# Patient Record
Sex: Male | Born: 1990 | Race: White | Hispanic: Yes | Marital: Married | Smoking: Never smoker
Health system: Southern US, Community
[De-identification: ages and names within clinical notes are randomized; demographics above are authoritative.]

## PROBLEM LIST (undated history)

## (undated) DIAGNOSIS — S069X9A Unspecified intracranial injury with loss of consciousness of unspecified duration, initial encounter: Secondary | ICD-10-CM

## (undated) DIAGNOSIS — G8194 Hemiplegia, unspecified affecting left nondominant side: Secondary | ICD-10-CM

## (undated) DIAGNOSIS — J189 Pneumonia, unspecified organism: Secondary | ICD-10-CM

## (undated) DIAGNOSIS — S52611G Displaced fracture of right ulna styloid process, subsequent encounter for closed fracture with delayed healing: Secondary | ICD-10-CM

## (undated) DIAGNOSIS — N179 Acute kidney failure, unspecified: Secondary | ICD-10-CM

## (undated) DIAGNOSIS — R6510 Systemic inflammatory response syndrome (SIRS) of non-infectious origin without acute organ dysfunction: Secondary | ICD-10-CM

## (undated) DIAGNOSIS — R7881 Bacteremia: Secondary | ICD-10-CM

## (undated) DIAGNOSIS — S069XAA Unspecified intracranial injury with loss of consciousness status unknown, initial encounter: Secondary | ICD-10-CM

## (undated) HISTORY — PX: TRACHEOSTOMY: SUR1362

## (undated) HISTORY — PX: PEG TUBE PLACEMENT: SUR1034

---

## 2016-05-15 ENCOUNTER — Inpatient Hospital Stay (HOSPITAL_COMMUNITY)
Admission: EM | Admit: 2016-05-15 | Discharge: 2016-05-25 | DRG: 871 | Disposition: A | Discharge status: Rehabilitation Facility | Payer: Medicaid Other | Source: Other Acute Inpatient Hospital | Attending: Internal Medicine | Admitting: Internal Medicine

## 2016-05-15 DIAGNOSIS — Z1612 Extended spectrum beta lactamase (ESBL) resistance: Secondary | ICD-10-CM | POA: Diagnosis not present

## 2016-05-15 DIAGNOSIS — H4902 Third [oculomotor] nerve palsy, left eye: Secondary | ICD-10-CM | POA: Diagnosis present

## 2016-05-15 DIAGNOSIS — M10062 Idiopathic gout, left knee: Secondary | ICD-10-CM | POA: Diagnosis present

## 2016-05-15 DIAGNOSIS — G8114 Spastic hemiplegia affecting left nondominant side: Secondary | ICD-10-CM | POA: Diagnosis not present

## 2016-05-15 DIAGNOSIS — R1313 Dysphagia, pharyngeal phase: Secondary | ICD-10-CM | POA: Diagnosis present

## 2016-05-15 DIAGNOSIS — Y95 Nosocomial condition: Secondary | ICD-10-CM | POA: Diagnosis present

## 2016-05-15 DIAGNOSIS — R7881 Bacteremia: Secondary | ICD-10-CM | POA: Diagnosis not present

## 2016-05-15 DIAGNOSIS — R000 Tachycardia, unspecified: Secondary | ICD-10-CM

## 2016-05-15 DIAGNOSIS — S069X9D Unspecified intracranial injury with loss of consciousness of unspecified duration, subsequent encounter: Secondary | ICD-10-CM | POA: Diagnosis not present

## 2016-05-15 DIAGNOSIS — E875 Hyperkalemia: Secondary | ICD-10-CM | POA: Diagnosis present

## 2016-05-15 DIAGNOSIS — L299 Pruritus, unspecified: Secondary | ICD-10-CM | POA: Diagnosis present

## 2016-05-15 DIAGNOSIS — D72819 Decreased white blood cell count, unspecified: Secondary | ICD-10-CM | POA: Diagnosis present

## 2016-05-15 DIAGNOSIS — Z930 Tracheostomy status: Secondary | ICD-10-CM | POA: Diagnosis not present

## 2016-05-15 DIAGNOSIS — E878 Other disorders of electrolyte and fluid balance, not elsewhere classified: Secondary | ICD-10-CM | POA: Diagnosis present

## 2016-05-15 DIAGNOSIS — Z87820 Personal history of traumatic brain injury: Secondary | ICD-10-CM

## 2016-05-15 DIAGNOSIS — N179 Acute kidney failure, unspecified: Secondary | ICD-10-CM | POA: Diagnosis present

## 2016-05-15 DIAGNOSIS — A419 Sepsis, unspecified organism: Principal | ICD-10-CM | POA: Diagnosis present

## 2016-05-15 DIAGNOSIS — S069X9A Unspecified intracranial injury with loss of consciousness of unspecified duration, initial encounter: Secondary | ICD-10-CM | POA: Diagnosis present

## 2016-05-15 DIAGNOSIS — Z79899 Other long term (current) drug therapy: Secondary | ICD-10-CM

## 2016-05-15 DIAGNOSIS — M25462 Effusion, left knee: Secondary | ICD-10-CM | POA: Diagnosis not present

## 2016-05-15 DIAGNOSIS — A021 Salmonella sepsis: Secondary | ICD-10-CM

## 2016-05-15 DIAGNOSIS — G8194 Hemiplegia, unspecified affecting left nondominant side: Secondary | ICD-10-CM | POA: Diagnosis present

## 2016-05-15 DIAGNOSIS — Z1624 Resistance to multiple antibiotics: Secondary | ICD-10-CM | POA: Diagnosis present

## 2016-05-15 DIAGNOSIS — E871 Hypo-osmolality and hyponatremia: Secondary | ICD-10-CM | POA: Diagnosis present

## 2016-05-15 DIAGNOSIS — M25562 Pain in left knee: Secondary | ICD-10-CM

## 2016-05-15 DIAGNOSIS — R339 Retention of urine, unspecified: Secondary | ICD-10-CM | POA: Diagnosis present

## 2016-05-15 DIAGNOSIS — Z931 Gastrostomy status: Secondary | ICD-10-CM

## 2016-05-15 DIAGNOSIS — N319 Neuromuscular dysfunction of bladder, unspecified: Secondary | ICD-10-CM | POA: Diagnosis not present

## 2016-05-15 DIAGNOSIS — S069XAA Unspecified intracranial injury with loss of consciousness status unknown, initial encounter: Secondary | ICD-10-CM | POA: Diagnosis present

## 2016-05-15 DIAGNOSIS — A499 Bacterial infection, unspecified: Secondary | ICD-10-CM | POA: Diagnosis not present

## 2016-05-15 DIAGNOSIS — D638 Anemia in other chronic diseases classified elsewhere: Secondary | ICD-10-CM | POA: Diagnosis present

## 2016-05-15 DIAGNOSIS — S069X4S Unspecified intracranial injury with loss of consciousness of 6 hours to 24 hours, sequela: Secondary | ICD-10-CM | POA: Diagnosis not present

## 2016-05-15 DIAGNOSIS — J150 Pneumonia due to Klebsiella pneumoniae: Secondary | ICD-10-CM | POA: Diagnosis present

## 2016-05-15 DIAGNOSIS — M792 Neuralgia and neuritis, unspecified: Secondary | ICD-10-CM | POA: Diagnosis not present

## 2016-05-15 DIAGNOSIS — J9621 Acute and chronic respiratory failure with hypoxia: Secondary | ICD-10-CM | POA: Diagnosis present

## 2016-05-15 DIAGNOSIS — R509 Fever, unspecified: Secondary | ICD-10-CM | POA: Diagnosis present

## 2016-05-15 DIAGNOSIS — M7989 Other specified soft tissue disorders: Secondary | ICD-10-CM | POA: Diagnosis not present

## 2016-05-15 DIAGNOSIS — K592 Neurogenic bowel, not elsewhere classified: Secondary | ICD-10-CM | POA: Diagnosis not present

## 2016-05-15 DIAGNOSIS — G8190 Hemiplegia, unspecified affecting unspecified side: Secondary | ICD-10-CM | POA: Diagnosis not present

## 2016-05-15 DIAGNOSIS — R5383 Other fatigue: Secondary | ICD-10-CM | POA: Diagnosis not present

## 2016-05-15 DIAGNOSIS — E162 Hypoglycemia, unspecified: Secondary | ICD-10-CM | POA: Diagnosis present

## 2016-05-15 DIAGNOSIS — J189 Pneumonia, unspecified organism: Secondary | ICD-10-CM | POA: Diagnosis not present

## 2016-05-15 DIAGNOSIS — R1312 Dysphagia, oropharyngeal phase: Secondary | ICD-10-CM | POA: Diagnosis not present

## 2016-05-15 DIAGNOSIS — G8100 Flaccid hemiplegia affecting unspecified side: Secondary | ICD-10-CM | POA: Diagnosis not present

## 2016-05-15 HISTORY — DX: Systemic inflammatory response syndrome (sirs) of non-infectious origin without acute organ dysfunction: R65.10

## 2016-05-15 HISTORY — DX: Bacteremia: R78.81

## 2016-05-15 HISTORY — DX: Pneumonia, unspecified organism: J18.9

## 2016-05-15 HISTORY — DX: Hemiplegia, unspecified affecting left nondominant side: G81.94

## 2016-05-15 HISTORY — DX: Unspecified intracranial injury with loss of consciousness status unknown, initial encounter: S06.9XAA

## 2016-05-15 HISTORY — DX: Acute kidney failure, unspecified: N17.9

## 2016-05-15 HISTORY — DX: Unspecified intracranial injury with loss of consciousness of unspecified duration, initial encounter: S06.9X9A

## 2016-05-15 HISTORY — DX: Displaced fracture of right ulna styloid process, subsequent encounter for closed fracture with delayed healing: S52.611G

## 2016-05-15 NOTE — Progress Notes (Addendum)
Family wants patient to go to [facility] at discharge. Contact information for the patient's sister: Alicia AmelHerminia [name] [phone]; Patient's niece speaks more english according to family: [name] ShihJenny [name] [phone]

## 2016-05-15 NOTE — Progress Notes (Signed)
Patient arrived to 2C12 from [facility]. Patient is spanish speaking. Oriented to room, in no apparent distress.

## 2016-05-16 ENCOUNTER — Encounter (HOSPITAL_COMMUNITY): Payer: Self-pay | Admitting: *Deleted

## 2016-05-16 ENCOUNTER — Inpatient Hospital Stay (HOSPITAL_COMMUNITY): Payer: Medicaid Other

## 2016-05-16 DIAGNOSIS — G8190 Hemiplegia, unspecified affecting unspecified side: Secondary | ICD-10-CM

## 2016-05-16 DIAGNOSIS — Z930 Tracheostomy status: Secondary | ICD-10-CM

## 2016-05-16 DIAGNOSIS — S069XAA Unspecified intracranial injury with loss of consciousness status unknown, initial encounter: Secondary | ICD-10-CM | POA: Diagnosis present

## 2016-05-16 DIAGNOSIS — S069X9A Unspecified intracranial injury with loss of consciousness of unspecified duration, initial encounter: Secondary | ICD-10-CM | POA: Diagnosis present

## 2016-05-16 DIAGNOSIS — Z931 Gastrostomy status: Secondary | ICD-10-CM

## 2016-05-16 DIAGNOSIS — A419 Sepsis, unspecified organism: Secondary | ICD-10-CM | POA: Diagnosis present

## 2016-05-16 DIAGNOSIS — R000 Tachycardia, unspecified: Secondary | ICD-10-CM

## 2016-05-16 LAB — URINE MICROSCOPIC-ADD ON

## 2016-05-16 LAB — BASIC METABOLIC PANEL
ANION GAP: 10 (ref 5–15)
BUN: 23 mg/dL — ABNORMAL HIGH (ref 6–20)
CO2: 22 mmol/L (ref 22–32)
Calcium: 8.9 mg/dL (ref 8.9–10.3)
Chloride: 100 mmol/L — ABNORMAL LOW (ref 101–111)
Creatinine, Ser: 1.44 mg/dL — ABNORMAL HIGH (ref 0.61–1.24)
GFR calc Af Amer: >60 mL/min (ref >60)
GLUCOSE: 96 mg/dL (ref 65–99)
POTASSIUM: 5.2 mmol/L — AB (ref 3.5–5.1)
Sodium: 132 mmol/L — ABNORMAL LOW (ref 135–145)

## 2016-05-16 LAB — URINALYSIS, ROUTINE W REFLEX MICROSCOPIC
Bilirubin Urine: NEGATIVE
GLUCOSE, UA: NEGATIVE mg/dL
Ketones, ur: NEGATIVE mg/dL
Nitrite: NEGATIVE
PH: 6 (ref 5.0–8.0)
PROTEIN: 100 mg/dL — AB
Specific Gravity, Urine: 1.014 (ref 1.005–1.030)

## 2016-05-16 LAB — CBC WITH DIFFERENTIAL/PLATELET
BASOS PCT: 1 %
Basophils Absolute: 0 10*3/uL (ref 0.0–0.1)
EOS ABS: 0.2 10*3/uL (ref 0.0–0.7)
EOS PCT: 8 %
HCT: 27.3 % — ABNORMAL LOW (ref 39.0–52.0)
Hemoglobin: 8.8 g/dL — ABNORMAL LOW (ref 13.0–17.0)
LYMPHS ABS: 0.8 10*3/uL (ref 0.7–4.0)
Lymphocytes Relative: 37 %
MCH: 30.1 pg (ref 26.0–34.0)
MCHC: 32.2 g/dL (ref 30.0–36.0)
MCV: 93.5 fL (ref 78.0–100.0)
MONO ABS: 0.5 10*3/uL (ref 0.1–1.0)
Monocytes Relative: 20 %
NEUTROS PCT: 34 %
Neutro Abs: 0.8 10*3/uL — ABNORMAL LOW (ref 1.7–7.7)
PLATELETS: 236 10*3/uL (ref 150–400)
RBC: 2.92 MIL/uL — AB (ref 4.22–5.81)
RDW: 14.7 % (ref 11.5–15.5)
WBC: 2.3 10*3/uL — AB (ref 4.0–10.5)

## 2016-05-16 LAB — COMPREHENSIVE METABOLIC PANEL
ALT: 15 U/L — AB (ref 17–63)
AST: 17 U/L (ref 15–41)
Albumin: 3.4 g/dL — ABNORMAL LOW (ref 3.5–5.0)
Alkaline Phosphatase: 73 U/L (ref 38–126)
Anion gap: 8 (ref 5–15)
BUN: 23 mg/dL — ABNORMAL HIGH (ref 6–20)
CHLORIDE: 105 mmol/L (ref 101–111)
CO2: 28 mmol/L (ref 22–32)
Calcium: 9.7 mg/dL (ref 8.9–10.3)
Creatinine, Ser: 1.31 mg/dL — ABNORMAL HIGH (ref 0.61–1.24)
Glucose, Bld: 90 mg/dL (ref 65–99)
POTASSIUM: 4.9 mmol/L (ref 3.5–5.1)
SODIUM: 141 mmol/L (ref 135–145)
Total Bilirubin: 0.8 mg/dL (ref 0.3–1.2)
Total Protein: 6.5 g/dL (ref 6.5–8.1)

## 2016-05-16 LAB — GLUCOSE, CAPILLARY
GLUCOSE-CAPILLARY: 109 mg/dL — AB (ref 65–99)
GLUCOSE-CAPILLARY: 89 mg/dL (ref 65–99)
Glucose-Capillary: 63 mg/dL — ABNORMAL LOW (ref 65–99)
Glucose-Capillary: 113 mg/dL — ABNORMAL HIGH (ref 65–99)
Glucose-Capillary: 87 mg/dL (ref 65–99)
Glucose-Capillary: 80 mg/dL (ref 65–99)

## 2016-05-16 LAB — APTT: APTT: 35 s (ref 24–36)

## 2016-05-16 LAB — PROTIME-INR
INR: 1.15
PROTHROMBIN TIME: 14.8 s (ref 11.4–15.2)

## 2016-05-16 LAB — MAGNESIUM: Magnesium: 2 mg/dL (ref 1.7–2.4)

## 2016-05-16 LAB — PROCALCITONIN: PROCALCITONIN: 0.11 ng/mL

## 2016-05-16 LAB — LACTIC ACID, PLASMA
LACTIC ACID, VENOUS: 0.8 mmol/L (ref 0.5–1.9)
LACTIC ACID, VENOUS: 1.3 mmol/L (ref 0.5–1.9)

## 2016-05-16 LAB — MRSA PCR SCREENING: MRSA by PCR: NEGATIVE

## 2016-05-16 MED ORDER — DEXTROSE 50 % IV SOLN
INTRAVENOUS | Status: AC
  Administered 2016-05-16: 25 mL via INTRAVENOUS
  Filled 2016-05-16: qty 50

## 2016-05-16 MED ORDER — ACETAMINOPHEN 325 MG PO TABS
650.0000 mg | ORAL_TABLET | Freq: Four times a day (QID) | ORAL | Status: DC | PRN
  Administered 2016-05-16 – 2016-05-22 (×5): 650 mg via ORAL
  Filled 2016-05-16 (×5): qty 2

## 2016-05-16 MED ORDER — DEXTROSE 50 % IV SOLN
25.0000 mL | Freq: Once | INTRAVENOUS | Status: AC
  Administered 2016-05-16: 25 mL via INTRAVENOUS

## 2016-05-16 MED ORDER — DEXTROSE-NACL 5-0.45 % IV SOLN
INTRAVENOUS | Status: DC
  Administered 2016-05-16: 12:00:00 via INTRAVENOUS

## 2016-05-16 MED ORDER — MORPHINE SULFATE (PF) 2 MG/ML IV SOLN
2.0000 mg | INTRAVENOUS | Status: DC | PRN
  Administered 2016-05-16 – 2016-05-24 (×14): 2 mg via INTRAVENOUS
  Filled 2016-05-16 (×14): qty 1

## 2016-05-16 MED ORDER — PANTOPRAZOLE SODIUM 40 MG PO TBEC
40.0000 mg | DELAYED_RELEASE_TABLET | Freq: Every day | ORAL | Status: DC
  Administered 2016-05-16 – 2016-05-25 (×10): 40 mg via ORAL
  Filled 2016-05-16 (×9): qty 1

## 2016-05-16 MED ORDER — VITAL AF 1.2 CAL PO LIQD
1000.0000 mL | ORAL | Status: DC
  Administered 2016-05-16 – 2016-05-24 (×14): 1000 mL
  Filled 2016-05-16 (×23): qty 1000

## 2016-05-16 MED ORDER — ACETAMINOPHEN 650 MG RE SUPP: 650.0000 mg | Freq: Four times a day (QID) | RECTAL | Status: DC | PRN

## 2016-05-16 MED ORDER — PHENOL 1.4 % MT LIQD
1.0000 | OROMUCOSAL | Status: DC | PRN
  Filled 2016-05-16: qty 177

## 2016-05-16 MED ORDER — DEXTROSE-NACL 5-0.9 % IV SOLN
INTRAVENOUS | Status: DC
  Administered 2016-05-16 – 2016-05-19 (×4): via INTRAVENOUS

## 2016-05-16 MED ORDER — PIPERACILLIN-TAZOBACTAM 3.375 G IVPB
3.3750 g | Freq: Three times a day (TID) | INTRAVENOUS | Status: DC
  Administered 2016-05-16 – 2016-05-18 (×7): 3.375 g via INTRAVENOUS
  Filled 2016-05-16 (×9): qty 50

## 2016-05-16 MED ORDER — ORAL CARE MOUTH RINSE
15.0000 mL | Freq: Two times a day (BID) | OROMUCOSAL | Status: DC
  Administered 2016-05-16 – 2016-05-25 (×18): 15 mL via OROMUCOSAL

## 2016-05-16 MED ORDER — ONDANSETRON HCL 4 MG PO TABS: 4.0000 mg | ORAL_TABLET | Freq: Four times a day (QID) | ORAL | Status: DC | PRN

## 2016-05-16 MED ORDER — SODIUM CHLORIDE 0.9 % IV SOLN
INTRAVENOUS | Status: DC
  Administered 2016-05-16: 01:00:00 via INTRAVENOUS

## 2016-05-16 MED ORDER — LORAZEPAM 2 MG/ML IJ SOLN
1.0000 mg | Freq: Once | INTRAMUSCULAR | Status: AC | PRN
  Administered 2016-05-16: 1 mg via INTRAVENOUS
  Filled 2016-05-16: qty 1

## 2016-05-16 MED ORDER — IPRATROPIUM-ALBUTEROL 0.5-2.5 (3) MG/3ML IN SOLN: 3.0000 mL | RESPIRATORY_TRACT | Status: DC | PRN

## 2016-05-16 MED ORDER — ONDANSETRON HCL 4 MG/2ML IJ SOLN: 4.0000 mg | Freq: Four times a day (QID) | INTRAMUSCULAR | Status: DC | PRN

## 2016-05-16 MED ORDER — INFLUENZA VAC SPLIT QUAD 0.5 ML IM SUSY
0.5000 mL | PREFILLED_SYRINGE | INTRAMUSCULAR | Status: AC
  Administered 2016-05-25: 0.5 mL via INTRAMUSCULAR
  Filled 2016-05-16 (×2): qty 0.5

## 2016-05-16 MED ORDER — METOPROLOL TARTRATE 25 MG PO TABS
25.0000 mg | ORAL_TABLET | Freq: Two times a day (BID) | ORAL | Status: DC
  Administered 2016-05-16 – 2016-05-20 (×10): 25 mg via ORAL
  Filled 2016-05-16 (×10): qty 1

## 2016-05-16 MED ORDER — CHLORHEXIDINE GLUCONATE 0.12 % MT SOLN
15.0000 mL | Freq: Two times a day (BID) | OROMUCOSAL | Status: DC
  Administered 2016-05-16 – 2016-05-25 (×19): 15 mL via OROMUCOSAL
  Filled 2016-05-16 (×7): qty 15

## 2016-05-16 MED ORDER — JEVITY 1.2 CAL PO LIQD
1000.0000 mL | ORAL | Status: DC
Start: 2016-05-16 — End: 2016-05-16

## 2016-05-16 MED ORDER — DIPHENHYDRAMINE HCL 25 MG PO CAPS
25.0000 mg | ORAL_CAPSULE | Freq: Four times a day (QID) | ORAL | Status: DC | PRN
Start: 2016-05-16 — End: 2016-05-25
  Administered 2016-05-16 – 2016-05-17 (×2): 25 mg via ORAL
  Filled 2016-05-16 (×2): qty 1

## 2016-05-16 MED ORDER — SODIUM CHLORIDE 0.9% FLUSH
3.0000 mL | Freq: Two times a day (BID) | INTRAVENOUS | Status: DC
  Administered 2016-05-16 – 2016-05-25 (×18): 3 mL via INTRAVENOUS

## 2016-05-16 MED ORDER — SODIUM CHLORIDE 0.9 % IV BOLUS (SEPSIS)
500.0000 mL | Freq: Once | INTRAVENOUS | Status: AC
  Administered 2016-05-16: 500 mL via INTRAVENOUS

## 2016-05-16 MED ORDER — VANCOMYCIN HCL IN DEXTROSE 750-5 MG/150ML-% IV SOLN
750.0000 mg | Freq: Three times a day (TID) | INTRAVENOUS | Status: DC
Start: 2016-05-16 — End: 2016-05-17
  Administered 2016-05-16 – 2016-05-17 (×5): 750 mg via INTRAVENOUS
  Filled 2016-05-16 (×6): qty 150
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
## 2016-05-16 NOTE — Progress Notes (Signed)
Hypoglycemic Event  CBG: 63  Treatment: D50 IV 25 mL  Symptoms: None  Follow-up CBG: Time:1009 CBG Result:113  Possible Reasons for Event: Inadequate meal intake  Comments/MD notified: MD Arriem notified via text page. Dietician paged for nutrition orders    Karn PicklerWalker, Heliodoro Domagalski L

## 2016-05-16 NOTE — Progress Notes (Signed)
PT Cancellation Note  Patient Details Name: [name] [name] MRN: [id number] DOB: 04/13/1991   Cancelled Treatment:    Reason Eval/Treat Not Completed: Patient not medically ready.  Patient remains on strict bedrest per orders.  *MD:  Please write activity orders when appropriate for patient.  PT will initiate evaluation at that time.  Thank you.   Vena AustriaDavis, Shakara Tweedy H 05/16/2016, 12:57 PM Durenda HurtSusan H. Renaldo Fiddleravis, PT, [facility] Acute Rehab Services Pager [phone]
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
## 2016-05-16 NOTE — Progress Notes (Signed)
Pt complaining of itching after Ativan given. Ativan added to allergy list and Elray McgregorMary Lynch, NP notified, benadryl given per NP order.

## 2016-05-16 NOTE — Progress Notes (Signed)
RT Note: Sputum sample collected and sent to lab. Pt had minimal amounts of thick tan/white secretions. Pt tolerated well. No complications noted
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
## 2016-05-16 NOTE — Progress Notes (Signed)
MD called earlier in the evening for soft BP, verbal order received for 500CC bolus of NS. BP after bolus completed is 96/63 with MAP of 75. MD made aware via text.
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
## 2016-05-17 ENCOUNTER — Inpatient Hospital Stay (HOSPITAL_COMMUNITY): Payer: Medicaid Other

## 2016-05-17 DIAGNOSIS — J189 Pneumonia, unspecified organism: Secondary | ICD-10-CM

## 2016-05-17 LAB — BASIC METABOLIC PANEL
ANION GAP: 9 (ref 5–15)
BUN: 24 mg/dL — ABNORMAL HIGH (ref 6–20)
CALCIUM: 8.5 mg/dL — AB (ref 8.9–10.3)
CHLORIDE: 106 mmol/L (ref 101–111)
CO2: 23 mmol/L (ref 22–32)
CREATININE: 1.81 mg/dL — AB (ref 0.61–1.24)
GFR calc Af Amer: 58 mL/min — ABNORMAL LOW (ref >60)
GFR calc non Af Amer: 50 mL/min — ABNORMAL LOW (ref >60)
Glucose, Bld: 106 mg/dL — ABNORMAL HIGH (ref 65–99)
POTASSIUM: 4.1 mmol/L (ref 3.5–5.1)
SODIUM: 138 mmol/L (ref 135–145)

## 2016-05-17 LAB — PREPARE RBC (CROSSMATCH)

## 2016-05-17 LAB — CBC WITH DIFFERENTIAL/PLATELET
BASOS PCT: 1 %
Basophils Absolute: 0 10*3/uL (ref 0.0–0.1)
EOS ABS: 0.5 10*3/uL (ref 0.0–0.7)
Eosinophils Relative: 23 %
HCT: 24.5 % — ABNORMAL LOW (ref 39.0–52.0)
HEMOGLOBIN: 7.8 g/dL — AB (ref 13.0–17.0)
LYMPHS PCT: 22 %
Lymphs Abs: 0.4 10*3/uL — ABNORMAL LOW (ref 0.7–4.0)
MCH: 30.5 pg (ref 26.0–34.0)
MCHC: 31.8 g/dL (ref 30.0–36.0)
MCV: 95.7 fL (ref 78.0–100.0)
MONOS PCT: 9 %
Monocytes Absolute: 0.2 10*3/uL (ref 0.1–1.0)
NEUTROS ABS: 0.9 10*3/uL — AB (ref 1.7–7.7)
NEUTROS PCT: 45 %
PLATELETS: 184 10*3/uL (ref 150–400)
RBC: 2.56 MIL/uL — ABNORMAL LOW (ref 4.22–5.81)
RDW: 15.3 % (ref 11.5–15.5)
WBC MORPHOLOGY: INCREASED
WBC: 2 10*3/uL — ABNORMAL LOW (ref 4.0–10.5)

## 2016-05-17 LAB — HEMOGLOBIN AND HEMATOCRIT, BLOOD
HCT: 22 % — ABNORMAL LOW (ref 39.0–52.0)
Hemoglobin: 7 g/dL — ABNORMAL LOW (ref 13.0–17.0)

## 2016-05-17 LAB — GLUCOSE, CAPILLARY
GLUCOSE-CAPILLARY: 137 mg/dL — AB (ref 65–99)
GLUCOSE-CAPILLARY: 117 mg/dL — AB (ref 65–99)
GLUCOSE-CAPILLARY: 113 mg/dL — AB (ref 65–99)
Glucose-Capillary: 94 mg/dL (ref 65–99)
Glucose-Capillary: 122 mg/dL — ABNORMAL HIGH (ref 65–99)
Glucose-Capillary: 133 mg/dL — ABNORMAL HIGH (ref 65–99)

## 2016-05-17 LAB — ABO/RH: ABO/RH(D): O NEG

## 2016-05-17 LAB — IRON AND TIBC
IRON: 43 ug/dL — AB (ref 45–182)
SATURATION RATIOS: 25 % (ref 17.9–39.5)
TIBC: 174 ug/dL — AB (ref 250–450)
UIBC: 131 ug/dL

## 2016-05-17 LAB — FERRITIN: FERRITIN: 782 ng/mL — AB (ref 24–336)

## 2016-05-17 MED ORDER — VANCOMYCIN HCL IN DEXTROSE 750-5 MG/150ML-% IV SOLN
750.0000 mg | Freq: Two times a day (BID) | INTRAVENOUS | Status: DC
  Administered 2016-05-17 – 2016-05-18 (×2): 750 mg via INTRAVENOUS
  Filled 2016-05-17 (×3): qty 150

## 2016-05-17 MED ORDER — SODIUM CHLORIDE 0.9 % IV BOLUS (SEPSIS)
500.0000 mL | Freq: Once | INTRAVENOUS | Status: AC
  Administered 2016-05-17: 500 mL via INTRAVENOUS

## 2016-05-17 MED ORDER — SODIUM CHLORIDE 0.9 % IV SOLN
Freq: Once | INTRAVENOUS | Status: AC
  Administered 2016-05-17: 17:00:00 via INTRAVENOUS

## 2016-05-17 MED ORDER — LEVOFLOXACIN IN D5W 750 MG/150ML IV SOLN
750.0000 mg | INTRAVENOUS | Status: DC
  Administered 2016-05-17 – 2016-05-18 (×2): 750 mg via INTRAVENOUS
  Filled 2016-05-17 (×2): qty 150
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
## 2016-05-17 NOTE — Progress Notes (Signed)
PT Cancellation Note  Patient Details Name: [name] [name] MRN: [id number] DOB: 04/22/1991   Cancelled Treatment:      Patient remains on strict bedrest per orders.  *MD:  Please write activity orders when appropriate for patient.  PT will initiate evaluation at that time   [name] [name], [name] [name] 05/17/2016, 7:23 AM
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
## 2016-05-18 ENCOUNTER — Inpatient Hospital Stay (HOSPITAL_COMMUNITY): Payer: Medicaid Other

## 2016-05-18 DIAGNOSIS — R7881 Bacteremia: Secondary | ICD-10-CM

## 2016-05-18 LAB — TYPE AND SCREEN
ABO/RH(D): O NEG
Antibody Screen: NEGATIVE
UNIT DIVISION: 0

## 2016-05-18 LAB — CULTURE, RESPIRATORY

## 2016-05-18 LAB — ECHOCARDIOGRAM COMPLETE
CHL CUP MV DEC (S): 127
CHL CUP RV SYS PRESS: 29 mmHg
E decel time: 127 msec
EERAT: 9.04
FS: 23 % — AB (ref 28–44)
Height: 67 in
IV/PV OW: 0.84
LA diam end sys: 33 mm
LADIAMINDEX: 1.74 cm/m2
LASIZE: 33 mm
LAVOL: 47.5 mL
LAVOLA4C: 37.6 mL
LAVOLIN: 25 mL/m2
LDCA: 5.31 cm2
LV E/e' medial: 9.04
LV TDI E'MEDIAL: 8.59
LV e' LATERAL: 10.3 cm/s
LVEEAVG: 9.04
LVOTD: 26 mm
Lateral S' vel: 12.9 cm/s
MV Peak grad: 3 mmHg
MV pk E vel: 93.1 m/s
MVPKAVEL: 59.7 m/s
PW: 10 mm — AB (ref 0.6–1.1)
RV TAPSE: 22.4 mm
Reg peak vel: 254 cm/s
TDI e' lateral: 10.3
TR max vel: 254 cm/s
Weight: 2751.34 oz

## 2016-05-18 LAB — CBC WITH DIFFERENTIAL/PLATELET
BASOS PCT: 1 %
Basophils Absolute: 0 10*3/uL (ref 0.0–0.1)
EOS ABS: 0.5 10*3/uL (ref 0.0–0.7)
EOS PCT: 30 %
HEMATOCRIT: 24.8 % — AB (ref 39.0–52.0)
Hemoglobin: 7.9 g/dL — ABNORMAL LOW (ref 13.0–17.0)
LYMPHS ABS: 0.5 10*3/uL — AB (ref 0.7–4.0)
Lymphocytes Relative: 32 %
MCH: 29.3 pg (ref 26.0–34.0)
MCHC: 31.9 g/dL (ref 30.0–36.0)
MCV: 91.9 fL (ref 78.0–100.0)
MONO ABS: 0.3 10*3/uL (ref 0.1–1.0)
Monocytes Relative: 16 %
Neutro Abs: 0.4 10*3/uL — ABNORMAL LOW (ref 1.7–7.7)
Neutrophils Relative %: 21 %
PLATELETS: 153 10*3/uL (ref 150–400)
RBC: 2.7 MIL/uL — ABNORMAL LOW (ref 4.22–5.81)
RDW: 16 % — AB (ref 11.5–15.5)
WBC Morphology: INCREASED
WBC: 1.7 10*3/uL — AB (ref 4.0–10.5)

## 2016-05-18 LAB — BASIC METABOLIC PANEL
ANION GAP: 9 (ref 5–15)
BUN: 19 mg/dL (ref 6–20)
CALCIUM: 8.4 mg/dL — AB (ref 8.9–10.3)
CO2: 21 mmol/L — ABNORMAL LOW (ref 22–32)
Chloride: 108 mmol/L (ref 101–111)
Creatinine, Ser: 1.53 mg/dL — ABNORMAL HIGH (ref 0.61–1.24)
GLUCOSE: 142 mg/dL — AB (ref 65–99)
Potassium: 4.7 mmol/L (ref 3.5–5.1)
Sodium: 138 mmol/L (ref 135–145)

## 2016-05-18 LAB — GLUCOSE, CAPILLARY
GLUCOSE-CAPILLARY: 122 mg/dL — AB (ref 65–99)
GLUCOSE-CAPILLARY: 106 mg/dL — AB (ref 65–99)
GLUCOSE-CAPILLARY: 105 mg/dL — AB (ref 65–99)
Glucose-Capillary: 100 mg/dL — ABNORMAL HIGH (ref 65–99)
Glucose-Capillary: 109 mg/dL — ABNORMAL HIGH (ref 65–99)
Glucose-Capillary: 126 mg/dL — ABNORMAL HIGH (ref 65–99)

## 2016-05-18 LAB — CULTURE, RESPIRATORY W GRAM STAIN

## 2016-05-18 LAB — PATHOLOGIST SMEAR REVIEW

## 2016-05-18 MED ORDER — SODIUM CHLORIDE 0.9 % IV SOLN
500.0000 mg | Freq: Four times a day (QID) | INTRAVENOUS | Status: DC
  Administered 2016-05-18 – 2016-05-25 (×26): 500 mg via INTRAVENOUS
  Filled 2016-05-18 (×30): qty 500

## 2016-05-18 NOTE — Progress Notes (Signed)
  Echocardiogram 2D Echocardiogram has been performed.  [name] [name], [name] [name] 05/18/2016, 5:39 PM
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
## 2016-05-18 NOTE — Progress Notes (Signed)
This RN spoke with MD Cena BentonVega. [name] [name] [facility] for wife to be able to come to [place] from [place] to visit husband ([name] [name]). MD Cena BentonVega request for CSW to find out how this process should be handled and he will write note if necessary at that time. Will speak with CSW.
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
## 2016-05-18 NOTE — Care Management Note (Signed)
Case Management Note  Patient Details  Name: Thadius Smisek MRN: [id number] Date of Birth: 02/01/1991  Subjective/Objective:   CM received referral for pt placement at [facility].  Per [facility] liaison, pt does not meet criteria for admission, plus niece states pt has medicaid which has no LTAC benefit.  CM met with mother and niece @ bedside and the mother stated she just wanted pt to get better so she could take him home and care for him and niece agreed. CM escorted niece to admissions office so she could provide ptt's social security number and medicaid number.                           Expected Discharge Plan:  [place]  Discharge planning Services  CM Consult  Status of Service:  In process, will continue to follow  Girard Cooter, RN 05/18/2016, 12:13 PM
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
## 2016-05-19 LAB — GLUCOSE, CAPILLARY
GLUCOSE-CAPILLARY: 113 mg/dL — AB (ref 65–99)
GLUCOSE-CAPILLARY: 141 mg/dL — AB (ref 65–99)
GLUCOSE-CAPILLARY: 128 mg/dL — AB (ref 65–99)
Glucose-Capillary: 113 mg/dL — ABNORMAL HIGH (ref 65–99)
Glucose-Capillary: 138 mg/dL — ABNORMAL HIGH (ref 65–99)
Glucose-Capillary: 150 mg/dL — ABNORMAL HIGH (ref 65–99)

## 2016-05-19 LAB — BASIC METABOLIC PANEL
ANION GAP: 11 (ref 5–15)
BUN: 18 mg/dL (ref 6–20)
CHLORIDE: 105 mmol/L (ref 101–111)
CO2: 24 mmol/L (ref 22–32)
Calcium: 8.8 mg/dL — ABNORMAL LOW (ref 8.9–10.3)
Creatinine, Ser: 1.44 mg/dL — ABNORMAL HIGH (ref 0.61–1.24)
GFR calc non Af Amer: >60 mL/min (ref >60)
Glucose, Bld: 99 mg/dL (ref 65–99)
Potassium: 4 mmol/L (ref 3.5–5.1)
Sodium: 140 mmol/L (ref 135–145)

## 2016-05-19 LAB — CBC
HEMATOCRIT: 24.4 % — AB (ref 39.0–52.0)
HEMOGLOBIN: 7.9 g/dL — AB (ref 13.0–17.0)
MCH: 30.2 pg (ref 26.0–34.0)
MCHC: 32.4 g/dL (ref 30.0–36.0)
MCV: 93.1 fL (ref 78.0–100.0)
Platelets: 153 10*3/uL (ref 150–400)
RBC: 2.62 MIL/uL — AB (ref 4.22–5.81)
RDW: 15.8 % — ABNORMAL HIGH (ref 11.5–15.5)
WBC: 1.5 10*3/uL — ABNORMAL LOW (ref 4.0–10.5)

## 2016-05-19 NOTE — Progress Notes (Signed)
PT and OT are recommending IP Rehab.  Will monitor for further progress in therapies before recommending IP rehab consult and to assist in best discharge disposition.  Please call if questions.  Weldon PickingSusan Maxen Rowland PT Inpatient Rehab Admissions Coordinator Cell [phone] Office [phone]

## 2016-05-19 NOTE — Progress Notes (Signed)
CSW and Spanish interpreter spoke with pt and pt niece at bedside concerning request for letter from MD for pt wife in [place] to be allowed to come to [place] to visit pt.  Pt niece did not know needed information about pt wife but will find out and provide information to Spanish interpreter who will pass along to CSW.  CSW will assist with writing letter to be signed by MD and then send to pt wife in [place] to bring to [place] with her when she applies to visit [place].  CSW will continue to follow  [name] SisJenna H. [name] [name], [facility] Clinical Social Worker [phone]

## 2016-05-19 NOTE — Progress Notes (Signed)
Patient was sleeping for the first time today. RT did not perform CPT in order to allow patient time to rest.
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
## 2016-05-20 LAB — GLUCOSE, CAPILLARY
GLUCOSE-CAPILLARY: 103 mg/dL — AB (ref 65–99)
Glucose-Capillary: 112 mg/dL — ABNORMAL HIGH (ref 65–99)
Glucose-Capillary: 124 mg/dL — ABNORMAL HIGH (ref 65–99)
Glucose-Capillary: 125 mg/dL — ABNORMAL HIGH (ref 65–99)
Glucose-Capillary: 125 mg/dL — ABNORMAL HIGH (ref 65–99)
Glucose-Capillary: 97 mg/dL (ref 65–99)

## 2016-05-20 LAB — CBC
HEMATOCRIT: 23.6 % — AB (ref 39.0–52.0)
HEMOGLOBIN: 7.5 g/dL — AB (ref 13.0–17.0)
MCH: 30 pg (ref 26.0–34.0)
MCHC: 31.8 g/dL (ref 30.0–36.0)
MCV: 94.4 fL (ref 78.0–100.0)
Platelets: 175 10*3/uL (ref 150–400)
RBC: 2.5 MIL/uL — ABNORMAL LOW (ref 4.22–5.81)
RDW: 15.4 % (ref 11.5–15.5)
WBC: 1.9 10*3/uL — ABNORMAL LOW (ref 4.0–10.5)

## 2016-05-20 LAB — BASIC METABOLIC PANEL
Anion gap: 9 (ref 5–15)
BUN: 21 mg/dL — AB (ref 6–20)
CALCIUM: 8.3 mg/dL — AB (ref 8.9–10.3)
CHLORIDE: 109 mmol/L (ref 101–111)
CO2: 22 mmol/L (ref 22–32)
CREATININE: 1.26 mg/dL — AB (ref 0.61–1.24)
GFR calc Af Amer: >60 mL/min (ref >60)
GFR calc non Af Amer: >60 mL/min (ref >60)
Glucose, Bld: 111 mg/dL — ABNORMAL HIGH (ref 65–99)
Potassium: 4.2 mmol/L (ref 3.5–5.1)
SODIUM: 140 mmol/L (ref 135–145)

## 2016-05-20 MED ORDER — ENOXAPARIN SODIUM 40 MG/0.4ML ~~LOC~~ SOLN
40.0000 mg | SUBCUTANEOUS | Status: DC
  Administered 2016-05-20 – 2016-05-25 (×6): 40 mg via SUBCUTANEOUS
  Filled 2016-05-20 (×5): qty 0.4

## 2016-05-20 MED ORDER — METOPROLOL TARTRATE 50 MG PO TABS
50.0000 mg | ORAL_TABLET | Freq: Two times a day (BID) | ORAL | Status: DC
  Administered 2016-05-20 – 2016-05-25 (×10): 50 mg via ORAL
  Filled 2016-05-20 (×9): qty 1
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
## 2016-05-20 NOTE — Progress Notes (Signed)
Spanish interpreter provided CSW with patients wife information- CSW wrote letter and it was signed by MD- letter sent to pt wife along with instructions on how to apply for temporary Visa  No further CSW needs at this time- CSW signing off  Burna SisJenna H. Natarsha Hurwitz, [facility] Clinical Social Worker [phone]
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
## 2016-05-21 ENCOUNTER — Inpatient Hospital Stay (HOSPITAL_COMMUNITY): Payer: Medicaid Other

## 2016-05-21 DIAGNOSIS — R509 Fever, unspecified: Secondary | ICD-10-CM

## 2016-05-21 DIAGNOSIS — A419 Sepsis, unspecified organism: Principal | ICD-10-CM

## 2016-05-21 DIAGNOSIS — Z1612 Extended spectrum beta lactamase (ESBL) resistance: Secondary | ICD-10-CM

## 2016-05-21 DIAGNOSIS — A499 Bacterial infection, unspecified: Secondary | ICD-10-CM

## 2016-05-21 DIAGNOSIS — Z930 Tracheostomy status: Secondary | ICD-10-CM

## 2016-05-21 DIAGNOSIS — M25562 Pain in left knee: Secondary | ICD-10-CM

## 2016-05-21 DIAGNOSIS — G8100 Flaccid hemiplegia affecting unspecified side: Secondary | ICD-10-CM

## 2016-05-21 LAB — CBC WITH DIFFERENTIAL/PLATELET
BASOS ABS: 0 10*3/uL (ref 0.0–0.1)
BASOS PCT: 1 %
EOS ABS: 0.3 10*3/uL (ref 0.0–0.7)
Eosinophils Relative: 13 %
HCT: 23.9 % — ABNORMAL LOW (ref 39.0–52.0)
HEMOGLOBIN: 7.5 g/dL — AB (ref 13.0–17.0)
LYMPHS PCT: 51 %
Lymphs Abs: 1.4 10*3/uL (ref 0.7–4.0)
MCH: 29.8 pg (ref 26.0–34.0)
MCHC: 31.4 g/dL (ref 30.0–36.0)
MCV: 94.8 fL (ref 78.0–100.0)
Monocytes Absolute: 0.7 10*3/uL (ref 0.1–1.0)
Monocytes Relative: 28 %
NEUTROS PCT: 7 %
Neutro Abs: 0.2 10*3/uL — ABNORMAL LOW (ref 1.7–7.7)
Platelets: 202 10*3/uL (ref 150–400)
RBC: 2.52 MIL/uL — ABNORMAL LOW (ref 4.22–5.81)
RDW: 15 % (ref 11.5–15.5)
WBC: 2.6 10*3/uL — ABNORMAL LOW (ref 4.0–10.5)

## 2016-05-21 LAB — COMPREHENSIVE METABOLIC PANEL
ALBUMIN: 2.5 g/dL — AB (ref 3.5–5.0)
ALK PHOS: 55 U/L (ref 38–126)
ALT: 17 U/L (ref 17–63)
AST: 20 U/L (ref 15–41)
Anion gap: 13 (ref 5–15)
BUN: 30 mg/dL — ABNORMAL HIGH (ref 6–20)
CALCIUM: 9 mg/dL (ref 8.9–10.3)
CO2: 24 mmol/L (ref 22–32)
CREATININE: 1.31 mg/dL — AB (ref 0.61–1.24)
Chloride: 105 mmol/L (ref 101–111)
GFR calc Af Amer: >60 mL/min (ref >60)
GFR calc non Af Amer: >60 mL/min (ref >60)
GLUCOSE: 106 mg/dL — AB (ref 65–99)
Potassium: 4.5 mmol/L (ref 3.5–5.1)
SODIUM: 142 mmol/L (ref 135–145)
Total Bilirubin: 0.6 mg/dL (ref 0.3–1.2)
Total Protein: 5.8 g/dL — ABNORMAL LOW (ref 6.5–8.1)

## 2016-05-21 LAB — CULTURE, BLOOD (ROUTINE X 2)
CULTURE: NO GROWTH
CULTURE: NO GROWTH

## 2016-05-21 LAB — GLUCOSE, CAPILLARY
GLUCOSE-CAPILLARY: 115 mg/dL — AB (ref 65–99)
Glucose-Capillary: 97 mg/dL (ref 65–99)
Glucose-Capillary: 101 mg/dL — ABNORMAL HIGH (ref 65–99)
Glucose-Capillary: 106 mg/dL — ABNORMAL HIGH (ref 65–99)
Glucose-Capillary: 90 mg/dL (ref 65–99)

## 2016-05-21 LAB — HIV ANTIBODY (ROUTINE TESTING W REFLEX): HIV SCREEN 4TH GENERATION: NONREACTIVE
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
## 2016-05-21 NOTE — Progress Notes (Signed)
Pharmacy Antibiotic Note  [name] is a 25 y.o. male admitted on 05/15/2016 with ESBL Klebsiella in trach aspirate on 9/30.  Pharmacy has been consulted for Primaxin dosing.  Pt with continued temps and ~stable Cr for last 3 days (overall improving trend).  ID to be consulted.  Plan: Continue Primaxin 500mg  IV q6 Pharmacy will sign-off, please reconsult as needed  Height: 5\' 7"  (170.2 cm) Weight: 174 lb 13.2 oz (79.3 kg) IBW/kg (Calculated) : 66.1  Temp (24hrs), Avg:99.8 F (37.7 C), Min:98.4 F (36.9 C), Max:101.9 F (38.8 C)   Recent Labs Lab 05/16/16 0127 05/16/16 0316  05/17/16 0340 05/18/16 0525 05/18/16 1512 05/19/16 0356 05/20/16 0236 05/21/16 0346  WBC 2.3*  --   --  2.0* 1.7*  --  1.5* 1.9* 2.6*  CREATININE 1.31*  --   < > 1.81*  --  1.53* 1.44* 1.26* 1.31*  LATICACIDVEN 0.8 1.3  --   --   --   --   --   --   --   < > = values in this interval not displayed.  Estimated Creatinine Clearance: 79.2 mL/min (by C-G formula based on SCr of 1.31 mg/dL (H)).    Allergies  Allergen Reactions  . Ativan [Lorazepam] Itching    Antimicrobials this admission:  9/30 Vancomycin >>10/2 9/30 Zosyn >>10/2 10/1 Levaquin>>10/2 Imipenem 10/2>>   Dose adjustments this admission:  10/1 vanc 750 q8 reduced 750 q12  Microbiology results:  9/30 BCx: ngtd 9/30 UCx:  9/30 TA: ESBL kleb pneumo  9/30 MRSA PCR: negative  Thank you for allowing pharmacy to be a part of this patient's care.  Marisue HumbleKendra Steele Ledonne, PharmD Clinical Pharmacist [facility]
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
## 2016-05-22 DIAGNOSIS — A021 Salmonella sepsis: Secondary | ICD-10-CM

## 2016-05-22 DIAGNOSIS — J150 Pneumonia due to Klebsiella pneumoniae: Secondary | ICD-10-CM

## 2016-05-22 DIAGNOSIS — Z87820 Personal history of traumatic brain injury: Secondary | ICD-10-CM

## 2016-05-22 DIAGNOSIS — Z1612 Extended spectrum beta lactamase (ESBL) resistance: Secondary | ICD-10-CM

## 2016-05-22 DIAGNOSIS — A499 Bacterial infection, unspecified: Secondary | ICD-10-CM

## 2016-05-22 DIAGNOSIS — Z9889 Other specified postprocedural states: Secondary | ICD-10-CM

## 2016-05-22 DIAGNOSIS — Z931 Gastrostomy status: Secondary | ICD-10-CM

## 2016-05-22 DIAGNOSIS — M25462 Effusion, left knee: Secondary | ICD-10-CM

## 2016-05-22 DIAGNOSIS — Y95 Nosocomial condition: Secondary | ICD-10-CM

## 2016-05-22 DIAGNOSIS — R509 Fever, unspecified: Secondary | ICD-10-CM

## 2016-05-22 LAB — GLUCOSE, CAPILLARY
GLUCOSE-CAPILLARY: 109 mg/dL — AB (ref 65–99)
GLUCOSE-CAPILLARY: 130 mg/dL — AB (ref 65–99)
GLUCOSE-CAPILLARY: 136 mg/dL — AB (ref 65–99)
GLUCOSE-CAPILLARY: 98 mg/dL (ref 65–99)
Glucose-Capillary: 100 mg/dL — ABNORMAL HIGH (ref 65–99)
Glucose-Capillary: 108 mg/dL — ABNORMAL HIGH (ref 65–99)

## 2016-05-22 LAB — URIC ACID: URIC ACID, SERUM: 8.3 mg/dL — AB (ref 4.4–7.6)

## 2016-05-22 LAB — CBC
HEMATOCRIT: 25.3 % — AB (ref 39.0–52.0)
HEMOGLOBIN: 8 g/dL — AB (ref 13.0–17.0)
MCH: 30 pg (ref 26.0–34.0)
MCHC: 31.6 g/dL (ref 30.0–36.0)
MCV: 94.8 fL (ref 78.0–100.0)
Platelets: 260 10*3/uL (ref 150–400)
RBC: 2.67 MIL/uL — ABNORMAL LOW (ref 4.22–5.81)
RDW: 14.7 % (ref 11.5–15.5)
WBC: 3.2 10*3/uL — ABNORMAL LOW (ref 4.0–10.5)

## 2016-05-22 LAB — GRAM STAIN

## 2016-05-22 LAB — SYNOVIAL CELL COUNT + DIFF, W/ CRYSTALS
EOSINOPHILS-SYNOVIAL: 0 % (ref 0–1)
Lymphocytes-Synovial Fld: 11 % (ref 0–20)
Monocyte-Macrophage-Synovial Fluid: 21 % — ABNORMAL LOW (ref 50–90)
Neutrophil, Synovial: 68 % — ABNORMAL HIGH (ref 0–25)
OTHER CELLS-SYN: 0
WBC, Synovial: 2550 /mm3 — ABNORMAL HIGH (ref 0–200)

## 2016-05-22 LAB — BASIC METABOLIC PANEL
Anion gap: 13 (ref 5–15)
BUN: 36 mg/dL — AB (ref 6–20)
CHLORIDE: 104 mmol/L (ref 101–111)
CO2: 23 mmol/L (ref 22–32)
Calcium: 9.6 mg/dL (ref 8.9–10.3)
Creatinine, Ser: 1.34 mg/dL — ABNORMAL HIGH (ref 0.61–1.24)
GFR calc Af Amer: >60 mL/min (ref >60)
GFR calc non Af Amer: >60 mL/min (ref >60)
Glucose, Bld: 99 mg/dL (ref 65–99)
Potassium: 4.8 mmol/L (ref 3.5–5.1)
Sodium: 140 mmol/L (ref 135–145)

## 2016-05-22 LAB — SEDIMENTATION RATE: Sed Rate: 128 mm/hr — ABNORMAL HIGH (ref 0–16)

## 2016-05-22 LAB — C-REACTIVE PROTEIN: CRP: 22.6 mg/dL — AB (ref <1.0)

## 2016-05-22 MED ORDER — LIDOCAINE HCL (PF) 1 % IJ SOLN
INTRAMUSCULAR | Status: AC
Start: 2016-05-22 — End: 2016-05-22
  Administered 2016-05-22: 5 mL via INTRAMUSCULAR
  Filled 2016-05-22: qty 5

## 2016-05-22 NOTE — Progress Notes (Signed)
Rehab admissions - I am following for potential acute inpatient rehab admission.  Patient is not ready for inpatient rehab at this point.  I will follow for progress.  Call me for questions.  #[phone]
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
## 2016-05-22 NOTE — Progress Notes (Signed)
Subjective: No new complaints   Antibiotics:  Anti-infectives    Start     Dose/Rate Route Frequency Ordered Stop   05/18/16 1800  imipenem-cilastatin (PRIMAXIN) 500 mg in sodium chloride 0.9 % 100 mL IVPB     500 mg 200 mL/hr over 30 Minutes Intravenous Every 6 hours 05/18/16 1507     05/17/16 2100  vancomycin (VANCOCIN) IVPB 750 mg/150 ml premix  Status:  Discontinued     750 mg 150 mL/hr over 60 Minutes Intravenous Every 12 hours 05/17/16 1151 05/18/16 1504   05/17/16 0600  levofloxacin (LEVAQUIN) IVPB 750 mg  Status:  Discontinued     750 mg 100 mL/hr over 90 Minutes Intravenous Every 24 hours 05/17/16 0551 05/18/16 1504   05/16/16 0200  piperacillin-tazobactam (ZOSYN) IVPB 3.375 g  Status:  Discontinued     3.375 g 12.5 mL/hr over 240 Minutes Intravenous Every 8 hours 05/16/16 0051 05/18/16 1224   05/16/16 0100  vancomycin (VANCOCIN) IVPB 750 mg/150 ml premix  Status:  Discontinued     750 mg 150 mL/hr over 60 Minutes Intravenous Every 8 hours 05/16/16 0051 05/17/16 1151      Medications: Scheduled Meds: . chlorhexidine  15 mL Mouth Rinse BID  . enoxaparin (LOVENOX) injection  40 mg Subcutaneous Q24H  . imipenem-cilastatin  500 mg Intravenous Q6H  . Influenza vac split quadrivalent PF  0.5 mL Intramuscular Tomorrow-1000  . lidocaine (PF)      . mouth rinse  15 mL Mouth Rinse q12n4p  . metoprolol tartrate  50 mg Oral BID  . pantoprazole  40 mg Oral Daily  . sodium chloride flush  3 mL Intravenous Q12H   Continuous Infusions: . feeding supplement (VITAL AF 1.2 CAL) 1,000 mL (05/22/16 0749)   PRN Meds:.acetaminophen **OR** acetaminophen, diphenhydrAMINE, ipratropium-albuterol, morphine injection, ondansetron **OR** ondansetron (ZOFRAN) IV, phenol    Objective: Weight change:   Intake/Output Summary (Last 24 hours) at 05/22/16 1701 Last data filed at 05/22/16 1250  Gross per 24 hour  Intake             1260 ml  Output             1750 ml  Net              -490 ml   Blood pressure 113/81, pulse (!) 126, temperature 99.4 F (37.4 C), temperature source Oral, resp. rate 12, height 5\' 7"  (1.702 m), weight 174 lb 13.2 oz (79.3 kg), SpO2 99 %. Temp:  [99.4 F (37.4 C)-101.2 F (38.4 C)] 99.4 F (37.4 C) (10/06 1248) Pulse Rate:  [108-126] 126 (10/06 1648) Resp:  [6-21] 12 (10/06 1648) BP: (113-133)/(81-89) 113/81 (10/06 1648) SpO2:  [97 %-100 %] 99 % (10/06 1648) FiO2 (%):  [28 %] 28 % (10/06 1648)  Physical Exam: General: Alert and awake, oriented person HEENT: anicteric sclera, pupils reactive to light and accommodation, EOMI, scarring on neck CVS regular rate, normal r,  no murmur rubs or gallops Chest: fairly  clear to auscultation bilaterally, reduced BS a the bases Abdomen: soft  nondistended, normal bowel sounds, Extremities: left sided effusion Skin: no rashes  Neuro: left sided hemiplegia  CBC:  CBC Latest Ref Rng & Units 05/22/2016 05/21/2016 05/20/2016  WBC 4.0 - 10.5 K/uL 3.2(L) 2.6(L) 1.9(L)  Hemoglobin 13.0 - 17.0 g/dL 8.0(L) 7.5(L) 7.5(L)  Hematocrit 39.0 - 52.0 % 25.3(L) 23.9(L) 23.6(L)  Platelets 150 - 400 K/uL 260 202 175  BMET  Recent Labs  05/21/16 0346 05/22/16 0226  NA 142 140  K 4.5 4.8  CL 105 104  CO2 24 23  GLUCOSE 106* 99  BUN 30* 36*  CREATININE 1.31* 1.34*  CALCIUM 9.0 9.6     Liver Panel   Recent Labs  05/21/16 0346  PROT 5.8*  ALBUMIN 2.5*  AST 20  ALT 17  ALKPHOS 55  BILITOT 0.6       Sedimentation Rate  Recent Labs  05/22/16 0226  ESRSEDRATE 128*   C-Reactive Protein  Recent Labs  05/22/16 0226  CRP 22.6*    Micro Results: Recent Results (from the past 720 hour(s))  MRSA PCR Screening     Status: None   Collection Time: 05/16/16 12:52 AM  Result Value Ref Range Status   MRSA by PCR NEGATIVE NEGATIVE Final    Comment:        The GeneXpert MRSA Assay (FDA approved for NASAL specimens only), is one component of a comprehensive MRSA  colonization surveillance program. It is not intended to diagnose MRSA infection nor to guide or monitor treatment for MRSA infections.   Culture, blood (x 2)     Status: None   Collection Time: 05/16/16  1:23 AM  Result Value Ref Range Status   Specimen Description BLOOD RIGHT ARM  Final   Special Requests BOTTLES DRAWN AEROBIC AND ANAEROBIC  Final   Culture NO GROWTH 5 DAYS  Final   Report Status 05/21/2016 FINAL  Final  Culture, blood (x 2)     Status: None   Collection Time: 05/16/16  1:30 AM  Result Value Ref Range Status   Specimen Description BLOOD RIGHT ARM  Final   Special Requests BOTTLES DRAWN AEROBIC AND ANAEROBIC  Final   Culture NO GROWTH 5 DAYS  Final   Report Status 05/21/2016 FINAL  Final  Culture, respiratory (NON-Expectorated)     Status: None   Collection Time: 05/16/16  1:35 AM  Result Value Ref Range Status   Specimen Description TRACHEAL ASPIRATE  Final   Special Requests NONE  Final   Gram Stain   Final    MODERATE WBC PRESENT,BOTH PMN AND MONONUCLEAR FEW SQUAMOUS EPITHELIAL CELLS PRESENT RARE GRAM NEGATIVE RODS    Culture   Final    FEW KLEBSIELLA PNEUMONIAE Confirmed Extended Spectrum Beta-Lactamase Producer (ESBL)    Report Status 05/18/2016 FINAL  Final   Organism ID, Bacteria KLEBSIELLA PNEUMONIAE  Final      Susceptibility   Klebsiella pneumoniae - MIC*    AMPICILLIN >=32 RESISTANT Resistant     CEFAZOLIN >=64 RESISTANT Resistant     CEFEPIME >=64 RESISTANT Resistant     CEFTAZIDIME 16 RESISTANT Resistant     CEFTRIAXONE >=64 RESISTANT Resistant     CIPROFLOXACIN 2 INTERMEDIATE Intermediate     GENTAMICIN >=16 RESISTANT Resistant     IMIPENEM <=0.25 SENSITIVE Sensitive     TRIMETH/SULFA >=320 RESISTANT Resistant     AMPICILLIN/SULBACTAM >=32 RESISTANT Resistant     PIP/TAZO 16 SENSITIVE Sensitive     Extended ESBL POSITIVE Resistant     * FEW KLEBSIELLA PNEUMONIAE  Culture, blood (routine x 2)     Status: None (Preliminary  result)   Collection Time: 05/21/16  4:00 PM  Result Value Ref Range Status   Specimen Description BLOOD RIGHT HAND  Final   Special Requests BOTTLES DRAWN AEROBIC ONLY 4CC  Final   Culture NO GROWTH < 24 HOURS  Final   Report Status  PENDING  Incomplete  Culture, blood (routine x 2)     Status: None (Preliminary result)   Collection Time: 05/21/16  4:05 PM  Result Value Ref Range Status   Specimen Description BLOOD LEFT HAND  Final   Special Requests IN PEDIATRIC BOTTLE 2CC  Final   Culture NO GROWTH < 24 HOURS  Final   Report Status PENDING  Incomplete    Studies/Results: Mr Knee Left  Wo Contrast  Result Date: 05/22/2016 CLINICAL DATA:  Fever of unknown origin EXAM: MRI OF THE LEFT KNEE WITHOUT CONTRAST TECHNIQUE: Multiplanar, multisequence MR imaging of the knee was performed. No intravenous contrast was administered. COMPARISON:  None. FINDINGS: MENISCI Medial meniscus: Grossly intact, but limited in evaluation as examination is not optimized for evaluation of the meniscus. Lateral meniscus: Grossly intact, but limited in evaluation as examination is not optimized for evaluation of the meniscus. LIGAMENTS Cruciates:  Intact ACL and PCL. Collaterals: Medial collateral ligament is intact. Lateral collateral ligament complex is intact. CARTILAGE Patellofemoral:  No chondral defect. Medial:  No chondral defect. Lateral:  No chondral defect. Joint: Large joint effusion with mild synovitis. Normal Hoffa's fat. No plical thickening. Popliteal Fossa:  No Baker cyst.  Intact popliteus tendon. Extensor Mechanism: Intact quadriceps tendon and patellar tendon. Intact medial and lateral patellar retinaculum. Bones: Mild subcortical marrow edema in the periphery of the lateral femoral condyle adjacent to the popliteus tendon insertion. No acute fracture dislocation. No periosteal reaction or bone destruction. Other: Nonspecific muscle edema in the lateral gastrocnemius muscle and along the posterior joint  capsule. Mild muscle edema in the popliteus muscle at the musculotendinous junction. IMPRESSION: 1. Large nonspecific joint effusion with mild synovitis. If there is clinical concern regarding septic arthritis, recommend arthrocentesis. No bone destruction or periosteal reaction to suggest osteomyelitis. Mild subcortical marrow edema at the popliteus tendon insertion likely reactive. 2. Nonspecific muscle edema in the lateral gastrocnemius muscle and along the posterior joint capsule. Mild muscle edema in the popliteus muscle at the musculotendinous junction. This may be secondary to muscle strain versus infectious myositis. No drainable soft tissue fluid collection to suggest an abscess. Electronically Signed   By: Elige Ko   On: 05/22/2016 08:10   Dg Chest Port 1v Same Day  Result Date: 05/21/2016 CLINICAL DATA:  25 year old male with a history of traumatic brain injury and now fever EXAM: PORTABLE CHEST 1 VIEW COMPARISON:  Prior chest x-ray 05/16/2016; prior CT scan of the chest 05/17/2016 FINDINGS: The tracheostomy tube is midline and at the level of the clavicles in good position. Cardiac and mediastinal contours remain unchanged. Slight interval increase in bibasilar patchy airspace opacities particularly on the left with partial obscure a shin of the left hemidiaphragm. Inspiratory volumes remain very low. The osseous structures are intact and unremarkable for age. IMPRESSION: 1. Compared to 05/16/2016 slightly increased bilateral left greater than right patchy lower lobe airspace opacities. Differential considerations include atelectasis and/or infiltrate, particularly on the left. 2. Inspiratory volumes remain very low. 3. Well-positioned tracheostomy tube. Electronically Signed   By: Malachy Moan M.D.   On: 05/21/2016 08:02      Assessment/Plan:  INTERVAL HISTORY: MRI showed effusion and this has been aspirated by Orthopedics   Principal Problem:   Sepsis (HCC) Active Problems:    Tachycardia   Traumatic brain injury (HCC)   Hemiplegia (HCC)   S/P percutaneous endoscopic gastrostomy (PEG) tube placement (HCC)   Tracheostomy in place [facility])   FUO (fever of unknown origin)   ESBL (  extended spectrum beta-lactamase) producing bacteria infection   Effusion of left knee    [name] [name] is a 25 y.o. male with  TBI, SDH, hemiplegia, tracheostomy, PEG with HCAP with ESBL on carbapanem with persistent now found to have a joint effusion that is been aspirated both peak surgery.  #1 Fever of unknown origin: Greatly appreciate orthopedic surgery's help in aspirating his joint effusion. If it appears consistent with infection he will need I&D in the operative room with material sent for culture. I would not broaden his antibiotics further in the interim  This could also certainly be gout and will see if there are crystals present his uric acid was slightly elevated. One could consider a treatment trial for gout but I would like to make sure we know what this is first and that is deathly not infection before going down route of treating for gout (in particular treating with steroids and (  #2 ESBL :PNA:  Finish 7 days of therapy. CXR with change in infiltrates. If any worsening of pulmonary symptoms repeat again and get another aspirate for culture  Dr. Ninetta Lights is covering this weekend.   LOS: 7 days   Acey Lav 05/22/2016, 5:01 PM
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
## 2016-05-22 NOTE — Progress Notes (Signed)
RT suctioned patient to obtain sputum sample for third time (twice yesterday) obtaining no secretions at all. RN made aware.
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
## 2016-05-23 DIAGNOSIS — B9689 Other specified bacterial agents as the cause of diseases classified elsewhere: Secondary | ICD-10-CM

## 2016-05-23 DIAGNOSIS — X58XXXD Exposure to other specified factors, subsequent encounter: Secondary | ICD-10-CM

## 2016-05-23 DIAGNOSIS — S069X9D Unspecified intracranial injury with loss of consciousness of unspecified duration, subsequent encounter: Secondary | ICD-10-CM

## 2016-05-23 DIAGNOSIS — M00862 Arthritis due to other bacteria, left knee: Secondary | ICD-10-CM

## 2016-05-23 LAB — GLUCOSE, CAPILLARY
GLUCOSE-CAPILLARY: 113 mg/dL — AB (ref 65–99)
GLUCOSE-CAPILLARY: 106 mg/dL — AB (ref 65–99)
Glucose-Capillary: 104 mg/dL — ABNORMAL HIGH (ref 65–99)
Glucose-Capillary: 108 mg/dL — ABNORMAL HIGH (ref 65–99)
Glucose-Capillary: 113 mg/dL — ABNORMAL HIGH (ref 65–99)
Glucose-Capillary: 123 mg/dL — ABNORMAL HIGH (ref 65–99)

## 2016-05-23 LAB — BASIC METABOLIC PANEL
ANION GAP: 9 (ref 5–15)
BUN: 45 mg/dL — AB (ref 6–20)
CALCIUM: 9.6 mg/dL (ref 8.9–10.3)
CO2: 25 mmol/L (ref 22–32)
CREATININE: 1.27 mg/dL — AB (ref 0.61–1.24)
Chloride: 107 mmol/L (ref 101–111)
GFR calc Af Amer: >60 mL/min (ref >60)
GLUCOSE: 117 mg/dL — AB (ref 65–99)
Potassium: 4.4 mmol/L (ref 3.5–5.1)
Sodium: 141 mmol/L (ref 135–145)

## 2016-05-23 LAB — CBC
HEMATOCRIT: 26.2 % — AB (ref 39.0–52.0)
HEMOGLOBIN: 8.1 g/dL — AB (ref 13.0–17.0)
MCH: 29.3 pg (ref 26.0–34.0)
MCHC: 30.9 g/dL (ref 30.0–36.0)
MCV: 94.9 fL (ref 78.0–100.0)
PLATELETS: 300 10*3/uL (ref 150–400)
RBC: 2.76 MIL/uL — AB (ref 4.22–5.81)
RDW: 14.3 % (ref 11.5–15.5)
WBC: 4.4 10*3/uL (ref 4.0–10.5)

## 2016-05-23 LAB — HCV COMMENT:

## 2016-05-23 LAB — HEPATITIS C ANTIBODY (REFLEX)

## 2016-05-23 NOTE — Plan of Care (Signed)
Problem: Pain Managment: Goal: General experience of comfort will improve Outcome: Progressing Patient able to inform nursing staff of pain through communication with family members in room.  Problem: Skin Integrity: Goal: Risk for impaired skin integrity will decrease Outcome: Progressing Moisture barrier cream applied to patient buttocks & groin to prevent further damage.  Problem: Fluid Volume: Goal: Ability to maintain a balanced intake and output will improve Outcome: Progressing Patient has adequate amounts of urine output with foley catheter in place.

## 2016-05-23 NOTE — Plan of Care (Signed)
Problem: Nutrition: Goal: Adequate nutrition will be maintained Outcome: Progressing Discussed with family the importance of keeping the head of bed above 30 degrees to prevent aspiration while receiving tube feeding with teach back displayed.

## 2016-05-23 NOTE — Progress Notes (Addendum)
Assessment: Principal Problem:   Sepsis (HCC) Active Problems:   Tachycardia   Traumatic brain injury (HCC)   Hemiplegia (HCC)   S/P percutaneous endoscopic gastrostomy (PEG) tube placement (HCC)   Tracheostomy in place [facility])   FUO (fever of unknown origin)   ESBL (extended spectrum beta-lactamase) producing bacteria infection   Effusion of left knee   Agree that the recent Left knee aspiration supports gout.   I do not think that I & D of his knee will be necessary. Should future results of Cx suggest otherwise or the picture changes clinically, please let orthopedics know. Please call with questions.  Objective:   VITALS:   Vitals:   05/23/16 1250 05/23/16 1251 05/23/16 1500 05/23/16 1608  BP: 117/73   114/79  Pulse: (!) 111 96 100 98  Resp: 19 17 17 15   Temp:    98.3 F (36.8 C)  TempSrc:    Oral  SpO2: 98% 98% 99% 99%  Weight:      Height:       Results for orders placed or performed during the hospital encounter of 05/15/16  MRSA PCR Screening     Status: None   Collection Time: 05/16/16 12:52 AM  Result Value Ref Range Status   MRSA by PCR NEGATIVE NEGATIVE Final    Comment:        The GeneXpert MRSA Assay (FDA approved for NASAL specimens only), is one component of a comprehensive MRSA colonization surveillance program. It is not intended to diagnose MRSA infection nor to guide or monitor treatment for MRSA infections.   Culture, blood (x 2)     Status: None   Collection Time: 05/16/16  1:23 AM  Result Value Ref Range Status   Specimen Description BLOOD RIGHT ARM  Final   Special Requests BOTTLES DRAWN AEROBIC AND ANAEROBIC  Final   Culture NO GROWTH 5 DAYS  Final   Report Status 05/21/2016 FINAL  Final  Culture, blood (x 2)     Status: None   Collection Time: 05/16/16  1:30 AM  Result Value Ref Range Status   Specimen Description BLOOD RIGHT ARM  Final   Special Requests BOTTLES DRAWN AEROBIC AND ANAEROBIC  Final   Culture NO  GROWTH 5 DAYS  Final   Report Status 05/21/2016 FINAL  Final  Culture, respiratory (NON-Expectorated)     Status: None   Collection Time: 05/16/16  1:35 AM  Result Value Ref Range Status   Specimen Description TRACHEAL ASPIRATE  Final   Special Requests NONE  Final   Gram Stain   Final    MODERATE WBC PRESENT,BOTH PMN AND MONONUCLEAR FEW SQUAMOUS EPITHELIAL CELLS PRESENT RARE GRAM NEGATIVE RODS    Culture   Final    FEW KLEBSIELLA PNEUMONIAE Confirmed Extended Spectrum Beta-Lactamase Producer (ESBL)    Report Status 05/18/2016 FINAL  Final   Organism ID, Bacteria KLEBSIELLA PNEUMONIAE  Final      Susceptibility   Klebsiella pneumoniae - MIC*    AMPICILLIN >=32 RESISTANT Resistant     CEFAZOLIN >=64 RESISTANT Resistant     CEFEPIME >=64 RESISTANT Resistant     CEFTAZIDIME 16 RESISTANT Resistant     CEFTRIAXONE >=64 RESISTANT Resistant     CIPROFLOXACIN 2 INTERMEDIATE Intermediate     GENTAMICIN >=16 RESISTANT Resistant     IMIPENEM <=0.25 SENSITIVE Sensitive     TRIMETH/SULFA >=320 RESISTANT Resistant     AMPICILLIN/SULBACTAM >=32 RESISTANT Resistant     PIP/TAZO 16 SENSITIVE  Sensitive     Extended ESBL POSITIVE Resistant     * FEW KLEBSIELLA PNEUMONIAE  Culture, blood (routine x 2)     Status: None (Preliminary result)   Collection Time: 05/21/16  4:00 PM  Result Value Ref Range Status   Specimen Description BLOOD RIGHT HAND  Final   Special Requests BOTTLES DRAWN AEROBIC ONLY 4CC  Final   Culture NO GROWTH 2 DAYS  Final   Report Status PENDING  Incomplete  Culture, blood (routine x 2)     Status: None (Preliminary result)   Collection Time: 05/21/16  4:05 PM  Result Value Ref Range Status   Specimen Description BLOOD LEFT HAND  Final   Special Requests IN PEDIATRIC BOTTLE 2CC  Final   Culture NO GROWTH 2 DAYS  Final   Report Status PENDING  Incomplete  Culture, body fluid-bottle     Status: None (Preliminary result)   Collection Time: 05/22/16  3:22 PM  Result Value  Ref Range Status   Specimen Description SYNOVIAL LEFT KNEE  Final   Special Requests BOTTLES DRAWN AEROBIC AND ANAEROBIC 5CC  Final   Culture NO GROWTH < 24 HOURS  Final   Report Status PENDING  Incomplete  Gram stain     Status: None   Collection Time: 05/22/16  3:22 PM  Result Value Ref Range Status   Specimen Description SYNOVIAL LEFT KNEE  Final   Special Requests NONE  Final   Gram Stain   Final    ABUNDANT WBC PRESENT,BOTH PMN AND MONONUCLEAR NO ORGANISMS SEEN    Report Status 05/22/2016 FINAL  Final     [name] KernHenry Calvin Martensen III, PA-C 05/23/2016, 5:09 PM
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
## 2016-05-23 NOTE — Plan of Care (Signed)
Problem: Nutrition: Goal: Adequate nutrition will be maintained Outcome: Progressing Discussed with family about feeding tube with some teach back displayed.
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
## 2016-05-23 NOTE — Progress Notes (Addendum)
INFECTIOUS DISEASE PROGRESS NOTE  ID: [name] [name] is a 25 y.o. male with  Principal Problem:   Sepsis (HCC) Active Problems:   Tachycardia   Traumatic brain injury (HCC)   Hemiplegia (HCC)   S/P percutaneous endoscopic gastrostomy (PEG) tube placement (HCC)   Tracheostomy in place [facility])   FUO (fever of unknown origin)   ESBL (extended spectrum beta-lactamase) producing bacteria infection   Effusion of left knee  Subjective: Awakens transiently.   Abtx:  Anti-infectives    Start     Dose/Rate Route Frequency Ordered Stop   05/18/16 1800  imipenem-cilastatin (PRIMAXIN) 500 mg in sodium chloride 0.9 % 100 mL IVPB     500 mg 200 mL/hr over 30 Minutes Intravenous Every 6 hours 05/18/16 1507     05/17/16 2100  vancomycin (VANCOCIN) IVPB 750 mg/150 ml premix  Status:  Discontinued     750 mg 150 mL/hr over 60 Minutes Intravenous Every 12 hours 05/17/16 1151 05/18/16 1504   05/17/16 0600  levofloxacin (LEVAQUIN) IVPB 750 mg  Status:  Discontinued     750 mg 100 mL/hr over 90 Minutes Intravenous Every 24 hours 05/17/16 0551 05/18/16 1504   05/16/16 0200  piperacillin-tazobactam (ZOSYN) IVPB 3.375 g  Status:  Discontinued     3.375 g 12.5 mL/hr over 240 Minutes Intravenous Every 8 hours 05/16/16 0051 05/18/16 1224   05/16/16 0100  vancomycin (VANCOCIN) IVPB 750 mg/150 ml premix  Status:  Discontinued     750 mg 150 mL/hr over 60 Minutes Intravenous Every 8 hours 05/16/16 0051 05/17/16 1151      Medications:  Scheduled: . chlorhexidine  15 mL Mouth Rinse BID  . enoxaparin (LOVENOX) injection  40 mg Subcutaneous Q24H  . imipenem-cilastatin  500 mg Intravenous Q6H  . Influenza vac split quadrivalent PF  0.5 mL Intramuscular Tomorrow-1000  . mouth rinse  15 mL Mouth Rinse q12n4p  . metoprolol tartrate  50 mg Oral BID  . pantoprazole  40 mg Oral Daily  . sodium chloride flush  3 mL Intravenous Q12H    Objective: Vital signs in last 24 hours: Temp:  [98 F (36.7 C)-99.4 F  (37.4 C)] 98 F (36.7 C) (10/07 0824) Pulse Rate:  [88-126] 120 (10/07 0939) Resp:  [12-28] 19 (10/07 0939) BP: (105-128)/(70-88) 116/80 (10/07 0939) SpO2:  [95 %-100 %] 99 % (10/07 0939) FiO2 (%):  [28 %] 28 % (10/07 0939) Weight:  [78.7 kg (173 lb 8 oz)] 78.7 kg (173 lb 8 oz) (10/07 0412)   General appearance: no distress Resp: diminished breath sounds bilaterally Cardio: regular rate and rhythm GI: normal findings: bowel sounds normal and soft, non-tender Extremities: R knee swollen, warm, non-tender.   Lab Results  Recent Labs  05/22/16 0226 05/23/16 0333  WBC 3.2* 4.4  HGB 8.0* 8.1*  HCT 25.3* 26.2*  NA 140 141  K 4.8 4.4  CL 104 107  CO2 23 25  BUN 36* 45*  CREATININE 1.34* 1.27*   Liver Panel  Recent Labs  05/21/16 0346  PROT 5.8*  ALBUMIN 2.5*  AST 20  ALT 17  ALKPHOS 55  BILITOT 0.6   Sedimentation Rate  Recent Labs  05/22/16 0226  ESRSEDRATE 128*   C-Reactive Protein  Recent Labs  05/22/16 0226  CRP 22.6*    Microbiology: Recent Results (from the past 240 hour(s))  MRSA PCR Screening     Status: None   Collection Time: 05/16/16 12:52 AM  Result Value Ref Range Status   MRSA  by PCR NEGATIVE NEGATIVE Final    Comment:        The GeneXpert MRSA Assay (FDA approved for NASAL specimens only), is one component of a comprehensive MRSA colonization surveillance program. It is not intended to diagnose MRSA infection nor to guide or monitor treatment for MRSA infections.   Culture, blood (x 2)     Status: None   Collection Time: 05/16/16  1:23 AM  Result Value Ref Range Status   Specimen Description BLOOD RIGHT ARM  Final   Special Requests BOTTLES DRAWN AEROBIC AND ANAEROBIC  Final   Culture NO GROWTH 5 DAYS  Final   Report Status 05/21/2016 FINAL  Final  Culture, blood (x 2)     Status: None   Collection Time: 05/16/16  1:30 AM  Result Value Ref Range Status   Specimen Description BLOOD RIGHT ARM  Final   Special Requests  BOTTLES DRAWN AEROBIC AND ANAEROBIC  Final   Culture NO GROWTH 5 DAYS  Final   Report Status 05/21/2016 FINAL  Final  Culture, respiratory (NON-Expectorated)     Status: None   Collection Time: 05/16/16  1:35 AM  Result Value Ref Range Status   Specimen Description TRACHEAL ASPIRATE  Final   Special Requests NONE  Final   Gram Stain   Final    MODERATE WBC PRESENT,BOTH PMN AND MONONUCLEAR FEW SQUAMOUS EPITHELIAL CELLS PRESENT RARE GRAM NEGATIVE RODS    Culture   Final    FEW KLEBSIELLA PNEUMONIAE Confirmed Extended Spectrum Beta-Lactamase Producer (ESBL)    Report Status 05/18/2016 FINAL  Final   Organism ID, Bacteria KLEBSIELLA PNEUMONIAE  Final      Susceptibility   Klebsiella pneumoniae - MIC*    AMPICILLIN >=32 RESISTANT Resistant     CEFAZOLIN >=64 RESISTANT Resistant     CEFEPIME >=64 RESISTANT Resistant     CEFTAZIDIME 16 RESISTANT Resistant     CEFTRIAXONE >=64 RESISTANT Resistant     CIPROFLOXACIN 2 INTERMEDIATE Intermediate     GENTAMICIN >=16 RESISTANT Resistant     IMIPENEM <=0.25 SENSITIVE Sensitive     TRIMETH/SULFA >=320 RESISTANT Resistant     AMPICILLIN/SULBACTAM >=32 RESISTANT Resistant     PIP/TAZO 16 SENSITIVE Sensitive     Extended ESBL POSITIVE Resistant     * FEW KLEBSIELLA PNEUMONIAE  Culture, blood (routine x 2)     Status: None (Preliminary result)   Collection Time: 05/21/16  4:00 PM  Result Value Ref Range Status   Specimen Description BLOOD RIGHT HAND  Final   Special Requests BOTTLES DRAWN AEROBIC ONLY 4CC  Final   Culture NO GROWTH < 24 HOURS  Final   Report Status PENDING  Incomplete  Culture, blood (routine x 2)     Status: None (Preliminary result)   Collection Time: 05/21/16  4:05 PM  Result Value Ref Range Status   Specimen Description BLOOD LEFT HAND  Final   Special Requests IN PEDIATRIC BOTTLE 2CC  Final   Culture NO GROWTH < 24 HOURS  Final   Report Status PENDING  Incomplete  Gram stain     Status: None   Collection Time:  05/22/16  3:22 PM  Result Value Ref Range Status   Specimen Description SYNOVIAL LEFT KNEE  Final   Special Requests NONE  Final   Gram Stain   Final    ABUNDANT WBC PRESENT,BOTH PMN AND MONONUCLEAR NO ORGANISMS SEEN    Report Status 05/22/2016 FINAL  Final    Studies/Results: Mr Knee  Left  Wo Contrast  Result Date: 05/22/2016 CLINICAL DATA:  Fever of unknown origin EXAM: MRI OF THE LEFT KNEE WITHOUT CONTRAST TECHNIQUE: Multiplanar, multisequence MR imaging of the knee was performed. No intravenous contrast was administered. COMPARISON:  None. FINDINGS: MENISCI Medial meniscus: Grossly intact, but limited in evaluation as examination is not optimized for evaluation of the meniscus. Lateral meniscus: Grossly intact, but limited in evaluation as examination is not optimized for evaluation of the meniscus. LIGAMENTS Cruciates:  Intact ACL and PCL. Collaterals: Medial collateral ligament is intact. Lateral collateral ligament complex is intact. CARTILAGE Patellofemoral:  No chondral defect. Medial:  No chondral defect. Lateral:  No chondral defect. Joint: Large joint effusion with mild synovitis. Normal Hoffa's fat. No plical thickening. Popliteal Fossa:  No Baker cyst.  Intact popliteus tendon. Extensor Mechanism: Intact quadriceps tendon and patellar tendon. Intact medial and lateral patellar retinaculum. Bones: Mild subcortical marrow edema in the periphery of the lateral femoral condyle adjacent to the popliteus tendon insertion. No acute fracture dislocation. No periosteal reaction or bone destruction. Other: Nonspecific muscle edema in the lateral gastrocnemius muscle and along the posterior joint capsule. Mild muscle edema in the popliteus muscle at the musculotendinous junction. IMPRESSION: 1. Large nonspecific joint effusion with mild synovitis. If there is clinical concern regarding septic arthritis, recommend arthrocentesis. No bone destruction or periosteal reaction to suggest osteomyelitis.  Mild subcortical marrow edema at the popliteus tendon insertion likely reactive. 2. Nonspecific muscle edema in the lateral gastrocnemius muscle and along the posterior joint capsule. Mild muscle edema in the popliteus muscle at the musculotendinous junction. This may be secondary to muscle strain versus infectious myositis. No drainable soft tissue fluid collection to suggest an abscess. Electronically Signed   By: Elige KoHetal  Patel   On: 05/22/2016 08:10     Assessment/Plan: Traumatic brain injury HCAP- K pnuemo (ESBL) Septic joint/L knee- Gout- 2,550 WBC (68% N)  Await Cx of joint aspiration Defer to surgery on need for I & D of joint- his crystals make it appear that he has gout No change in anbx for HCAP/klebsiella Available as needed on 10-8, will f/u Cx.   Total days of antibiotics: 8 (imipenemem 10-2)         [name] [name] Infectious Diseases (pager) [phone] [email or web address] 05/23/2016, 11:13 AM  LOS: 8 days

## 2016-05-23 NOTE — Progress Notes (Signed)
Tracheal Aspirate unable to be obtained. No secretions upon trachel suctioning.

## 2016-05-24 LAB — GLUCOSE, CAPILLARY
GLUCOSE-CAPILLARY: 90 mg/dL (ref 65–99)
GLUCOSE-CAPILLARY: 120 mg/dL — AB (ref 65–99)
Glucose-Capillary: 106 mg/dL — ABNORMAL HIGH (ref 65–99)
Glucose-Capillary: 115 mg/dL — ABNORMAL HIGH (ref 65–99)
Glucose-Capillary: 97 mg/dL (ref 65–99)

## 2016-05-24 LAB — BASIC METABOLIC PANEL
ANION GAP: 10 (ref 5–15)
BUN: 49 mg/dL — ABNORMAL HIGH (ref 6–20)
CHLORIDE: 105 mmol/L (ref 101–111)
CO2: 25 mmol/L (ref 22–32)
CREATININE: 1.24 mg/dL (ref 0.61–1.24)
Calcium: 9.5 mg/dL (ref 8.9–10.3)
GFR calc non Af Amer: >60 mL/min (ref >60)
Glucose, Bld: 93 mg/dL (ref 65–99)
POTASSIUM: 4.9 mmol/L (ref 3.5–5.1)
Sodium: 140 mmol/L (ref 135–145)

## 2016-05-24 LAB — CBC
HEMATOCRIT: 26.9 % — AB (ref 39.0–52.0)
HEMOGLOBIN: 8.4 g/dL — AB (ref 13.0–17.0)
MCH: 29.6 pg (ref 26.0–34.0)
MCHC: 31.2 g/dL (ref 30.0–36.0)
MCV: 94.7 fL (ref 78.0–100.0)
Platelets: 365 10*3/uL (ref 150–400)
RBC: 2.84 MIL/uL — AB (ref 4.22–5.81)
RDW: 14.4 % (ref 11.5–15.5)
WBC: 6 10*3/uL (ref 4.0–10.5)

## 2016-05-24 MED ORDER — COLCHICINE 0.6 MG PO TABS
0.6000 mg | ORAL_TABLET | Freq: Two times a day (BID) | ORAL | Status: DC
  Administered 2016-05-24 – 2016-05-25 (×3): 0.6 mg via ORAL
  Filled 2016-05-24 (×3): qty 1

## 2016-05-24 MED ORDER — COLCHICINE 0.6 MG PO TABS
1.2000 mg | ORAL_TABLET | Freq: Once | ORAL | Status: AC
  Administered 2016-05-24: 1.2 mg via ORAL
  Filled 2016-05-24: qty 2

## 2016-05-24 NOTE — Plan of Care (Signed)
Problem: Education: Goal: Knowledge of  General Education information/materials will improve Outcome: Progressing Discussed with patient and family about pain management and plan of care for the evening with some teach back displayed.
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
## 2016-05-25 ENCOUNTER — Inpatient Hospital Stay (HOSPITAL_COMMUNITY)
Admission: RE | Admit: 2016-05-25 | Discharge: 2016-06-19 | DRG: 949 | Disposition: A | Discharge status: Home / Self Care | Payer: Medicaid Other | Source: Intra-hospital | Attending: Physical Medicine & Rehabilitation | Admitting: Physical Medicine & Rehabilitation

## 2016-05-25 ENCOUNTER — Encounter (HOSPITAL_COMMUNITY): Payer: Self-pay | Admitting: *Deleted

## 2016-05-25 DIAGNOSIS — M792 Neuralgia and neuritis, unspecified: Secondary | ICD-10-CM

## 2016-05-25 DIAGNOSIS — Z930 Tracheostomy status: Secondary | ICD-10-CM | POA: Diagnosis not present

## 2016-05-25 DIAGNOSIS — G8194 Hemiplegia, unspecified affecting left nondominant side: Secondary | ICD-10-CM | POA: Diagnosis not present

## 2016-05-25 DIAGNOSIS — R1312 Dysphagia, oropharyngeal phase: Secondary | ICD-10-CM

## 2016-05-25 DIAGNOSIS — S06890D Other specified intracranial injury without loss of consciousness, subsequent encounter: Secondary | ICD-10-CM | POA: Diagnosis present

## 2016-05-25 DIAGNOSIS — R5383 Other fatigue: Secondary | ICD-10-CM

## 2016-05-25 DIAGNOSIS — B961 Klebsiella pneumoniae [K. pneumoniae] as the cause of diseases classified elsewhere: Secondary | ICD-10-CM | POA: Diagnosis present

## 2016-05-25 DIAGNOSIS — N179 Acute kidney failure, unspecified: Secondary | ICD-10-CM | POA: Diagnosis not present

## 2016-05-25 DIAGNOSIS — Z1612 Extended spectrum beta lactamase (ESBL) resistance: Secondary | ICD-10-CM | POA: Diagnosis present

## 2016-05-25 DIAGNOSIS — Z931 Gastrostomy status: Secondary | ICD-10-CM | POA: Diagnosis not present

## 2016-05-25 DIAGNOSIS — S069X4S Unspecified intracranial injury with loss of consciousness of 6 hours to 24 hours, sequela: Secondary | ICD-10-CM | POA: Diagnosis not present

## 2016-05-25 DIAGNOSIS — G8114 Spastic hemiplegia affecting left nondominant side: Secondary | ICD-10-CM | POA: Diagnosis present

## 2016-05-25 DIAGNOSIS — S069X4A Unspecified intracranial injury with loss of consciousness of 6 hours to 24 hours, initial encounter: Secondary | ICD-10-CM | POA: Diagnosis present

## 2016-05-25 DIAGNOSIS — K592 Neurogenic bowel, not elsewhere classified: Secondary | ICD-10-CM

## 2016-05-25 DIAGNOSIS — M109 Gout, unspecified: Secondary | ICD-10-CM | POA: Diagnosis present

## 2016-05-25 DIAGNOSIS — N319 Neuromuscular dysfunction of bladder, unspecified: Secondary | ICD-10-CM | POA: Diagnosis present

## 2016-05-25 DIAGNOSIS — M7989 Other specified soft tissue disorders: Secondary | ICD-10-CM | POA: Diagnosis not present

## 2016-05-25 DIAGNOSIS — R509 Fever, unspecified: Secondary | ICD-10-CM

## 2016-05-25 LAB — GLUCOSE, CAPILLARY
GLUCOSE-CAPILLARY: 109 mg/dL — AB (ref 65–99)
Glucose-Capillary: 102 mg/dL — ABNORMAL HIGH (ref 65–99)
Glucose-Capillary: 103 mg/dL — ABNORMAL HIGH (ref 65–99)
Glucose-Capillary: 110 mg/dL — ABNORMAL HIGH (ref 65–99)
Glucose-Capillary: 114 mg/dL — ABNORMAL HIGH (ref 65–99)
Glucose-Capillary: 122 mg/dL — ABNORMAL HIGH (ref 65–99)

## 2016-05-25 MED ORDER — METOPROLOL TARTRATE 50 MG PO TABS
50.0000 mg | ORAL_TABLET | Freq: Two times a day (BID) | ORAL | Status: DC
  Administered 2016-05-25 – 2016-06-18 (×47): 50 mg
  Filled 2016-05-25 (×49): qty 1

## 2016-05-25 MED ORDER — COLCHICINE 0.6 MG PO TABS
0.6000 mg | ORAL_TABLET | Freq: Two times a day (BID) | ORAL | Status: DC
  Administered 2016-05-25 – 2016-06-08 (×28): 0.6 mg
  Filled 2016-05-25 (×28): qty 1

## 2016-05-25 MED ORDER — ACETAMINOPHEN 325 MG PO TABS: 650.0000 mg | ORAL_TABLET | Freq: Four times a day (QID) | ORAL | Status: DC | PRN

## 2016-05-25 MED ORDER — IPRATROPIUM-ALBUTEROL 0.5-2.5 (3) MG/3ML IN SOLN: 3.0000 mL | RESPIRATORY_TRACT | Status: DC | PRN

## 2016-05-25 MED ORDER — IPRATROPIUM-ALBUTEROL 0.5-2.5 (3) MG/3ML IN SOLN: 3.0000 mL | RESPIRATORY_TRACT | 0 refills | Status: DC | PRN

## 2016-05-25 MED ORDER — ACETAMINOPHEN 325 MG PO TABS
650.0000 mg | ORAL_TABLET | Freq: Four times a day (QID) | ORAL | Status: DC | PRN
  Administered 2016-06-01 – 2016-06-19 (×10): 650 mg via ORAL
  Filled 2016-05-25 (×11): qty 2

## 2016-05-25 MED ORDER — METOPROLOL TARTRATE 50 MG PO TABS: 50.0000 mg | ORAL_TABLET | Freq: Two times a day (BID) | ORAL | 0 refills | Status: DC

## 2016-05-25 MED ORDER — OMEPRAZOLE 2 MG/ML ORAL SUSPENSION: 40.0000 mg | Freq: Every day | ORAL | 0 refills | Status: DC

## 2016-05-25 MED ORDER — VITAL AF 1.2 CAL PO LIQD: 1000.0000 mL | ORAL | 1 refills | Status: DC

## 2016-05-25 MED ORDER — ACETAMINOPHEN 650 MG RE SUPP: 650.0000 mg | Freq: Four times a day (QID) | RECTAL | Status: DC | PRN

## 2016-05-25 MED ORDER — VITAL AF 1.2 CAL PO LIQD
1000.0000 mL | ORAL | Status: DC
  Filled 2016-05-25 (×4): qty 1000

## 2016-05-25 MED ORDER — ENOXAPARIN SODIUM 40 MG/0.4ML ~~LOC~~ SOLN: 40.0000 mg | SUBCUTANEOUS | Status: DC

## 2016-05-25 MED ORDER — PANTOPRAZOLE SODIUM 40 MG PO TBEC
40.0000 mg | DELAYED_RELEASE_TABLET | Freq: Every day | ORAL | Status: DC
  Administered 2016-05-26 – 2016-05-27 (×2): 40 mg via ORAL
  Filled 2016-05-25 (×2): qty 1

## 2016-05-25 MED ORDER — ENOXAPARIN SODIUM 40 MG/0.4ML ~~LOC~~ SOLN
40.0000 mg | SUBCUTANEOUS | Status: DC
  Administered 2016-05-25 – 2016-06-17 (×24): 40 mg via SUBCUTANEOUS
  Filled 2016-05-25 (×25): qty 0.4

## 2016-05-25 MED ORDER — COLCHICINE 0.6 MG PO TABS: 0.6000 mg | ORAL_TABLET | Freq: Two times a day (BID) | ORAL | 0 refills | Status: DC
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
## 2016-05-25 NOTE — Progress Notes (Signed)
Rehab admissions - I met with patient, mom, brother-in-law, niece, and another family member at the bedside.  Interpreter, Mudlogger, joined [place] to speak Spanish with the family.  I went over inpatient rehab in detail with family.  I answered many questions.  Family understand that the expectation is that family take patient home at the end of rehab stay and family to provide care.  Family hope to get trach out prior to discharge and they are hopeful that we will educate many family members during the rehab stay so that they can provide care to patient upon discharge.  Bed available and will admit to acute inpatient rehab today.  Call me for questions.  #[phone]
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
## 2016-05-25 NOTE — Progress Notes (Signed)
      INFECTIOUS DISEASE ATTENDING ADDENDUM:   Date: 05/25/2016  Patient name: [name] [name]  Medical record number: [id number]  Date of birth: 02/18/1991    Patient afebrile with treatment for gout and finished with rx for his HCAP  I will sign off please call with further questions.   Paulette BlanchCornelius Van Dam 05/25/2016, 11:39 AM

## 2016-05-25 NOTE — Interval H&P Note (Signed)
Nevada CraneOmar Kratzke was admitted today to Inpatient Rehabilitation with the diagnosis of TBI.  The patient's history has been reviewed, patient examined, and there is no change in status.  Patient continues to be appropriate for intensive inpatient rehabilitation.  I have reviewed the patient's chart and labs.  Questions were answered to the patient's satisfaction. The PAPE has been reviewed and assessment remains appropriate.  SWARTZ,ZACHARY T 05/25/2016, 8:04 PM

## 2016-05-25 NOTE — Progress Notes (Signed)
Obturator was above the bed. The ambu bag and another trach wasn't in the room. RT told the nurse what was needed for th room

## 2016-05-25 NOTE — Progress Notes (Signed)
Patient ID: [name] [name], male   DOB: 12/28/1990, 25 y.o.   MRN: [id number]  Patient and family arrived from [facility] with RN. Patient and family oriented to new room, rehab process, schedule, fall prevention plan, safety plan, and health resource notebook. Patient resting comfortably in bed, 4 side-rails up due to air mattress, family at bedside, and no complaints of pain.
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
## 2016-05-25 NOTE — Care Management Note (Signed)
Case Management Note  Patient Details  Name: Marquese Burkland MRN: [id number] Date of Birth: 01-05-1991  Subjective/Objective:  CM and Inpt Rehab Admissons Coordinator met @ bedside with pt's mother, brother-in-law, and niece, with interpreter, to discuss inpt rehab stay vs home with home health or SNF.  Family's choice is transfer to inpt rehab with plan to take pt home after rehab stay.  CM talked with [facility] liaison and they accept referral for services after rehab stay.  Per Development worker, community, medicaid is pending.                           Expected Discharge Plan:  [place]  Discharge planning Services  CM Consult  HH Arranged:  RN, PT, OT [facility] Agency:  [address]  Status of Service:  Completed, signed off  Girard Cooter, [place] 05/25/2016, 2:06 PM

## 2016-05-25 NOTE — Progress Notes (Signed)
RT placed an ambu bag in the Pt's room and asked the nurse to order a 6 cuff trach. RT also placed SX catheters, trach care kits and drain gauze int he room
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
## 2016-05-26 ENCOUNTER — Inpatient Hospital Stay (HOSPITAL_COMMUNITY): Payer: Medicaid Other | Admitting: Occupational Therapy

## 2016-05-26 ENCOUNTER — Inpatient Hospital Stay (HOSPITAL_COMMUNITY): Payer: Medicaid Other | Admitting: Speech Pathology

## 2016-05-26 ENCOUNTER — Inpatient Hospital Stay (HOSPITAL_COMMUNITY): Payer: Medicaid Other

## 2016-05-26 ENCOUNTER — Inpatient Hospital Stay (HOSPITAL_COMMUNITY): Payer: Self-pay | Admitting: Physical Therapy

## 2016-05-26 DIAGNOSIS — M7989 Other specified soft tissue disorders: Secondary | ICD-10-CM

## 2016-05-26 LAB — CBC WITH DIFFERENTIAL/PLATELET
BASOS ABS: 0.2 10*3/uL — AB (ref 0.0–0.1)
Basophils Relative: 2 %
Eosinophils Absolute: 0.7 10*3/uL (ref 0.0–0.7)
Eosinophils Relative: 8 %
HCT: 28.6 % — ABNORMAL LOW (ref 39.0–52.0)
HEMOGLOBIN: 8.6 g/dL — AB (ref 13.0–17.0)
LYMPHS ABS: 2.1 10*3/uL (ref 0.7–4.0)
LYMPHS PCT: 23 %
MCH: 29.1 pg (ref 26.0–34.0)
MCHC: 30.1 g/dL (ref 30.0–36.0)
MCV: 96.6 fL (ref 78.0–100.0)
MONOS PCT: 6 %
Monocytes Absolute: 0.6 10*3/uL (ref 0.1–1.0)
Neutro Abs: 5.6 10*3/uL (ref 1.7–7.7)
Neutrophils Relative %: 61 %
Platelets: 473 10*3/uL — ABNORMAL HIGH (ref 150–400)
RBC: 2.96 MIL/uL — ABNORMAL LOW (ref 4.22–5.81)
RDW: 14.4 % (ref 11.5–15.5)
WBC: 9.2 10*3/uL (ref 4.0–10.5)

## 2016-05-26 LAB — COMPREHENSIVE METABOLIC PANEL
ALK PHOS: 63 U/L (ref 38–126)
ALT: 25 U/L (ref 17–63)
AST: 23 U/L (ref 15–41)
Albumin: 3.2 g/dL — ABNORMAL LOW (ref 3.5–5.0)
Anion gap: 12 (ref 5–15)
BUN: 59 mg/dL — AB (ref 6–20)
CALCIUM: 10.1 mg/dL (ref 8.9–10.3)
CO2: 25 mmol/L (ref 22–32)
CREATININE: 1.25 mg/dL — AB (ref 0.61–1.24)
Chloride: 105 mmol/L (ref 101–111)
Glucose, Bld: 95 mg/dL (ref 65–99)
Potassium: 3.9 mmol/L (ref 3.5–5.1)
SODIUM: 142 mmol/L (ref 135–145)
Total Bilirubin: 0.4 mg/dL (ref 0.3–1.2)
Total Protein: 7.6 g/dL (ref 6.5–8.1)

## 2016-05-26 LAB — CULTURE, BLOOD (ROUTINE X 2)
CULTURE: NO GROWTH
Culture: NO GROWTH

## 2016-05-26 MED ORDER — FREE WATER
200.0000 mL | Freq: Three times a day (TID) | Status: DC
  Administered 2016-05-26 (×2): 200 mL

## 2016-05-26 MED ORDER — JEVITY 1.5 CAL/FIBER PO LIQD
1000.0000 mL | ORAL | Status: DC
  Administered 2016-05-26 – 2016-05-28 (×3): 1000 mL
  Filled 2016-05-26 (×6): qty 1000

## 2016-05-26 MED ORDER — FREE WATER
200.0000 mL | Freq: Every day | Status: DC
  Administered 2016-05-26 – 2016-05-29 (×14): 200 mL
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
## 2016-05-26 NOTE — Progress Notes (Signed)
[place] PHYSICAL MEDICINE & REHABILITATION     PROGRESS NOTE    Subjective/Complaints: Had a good evening per brother. No breathing issues. Denies pain.   ROS limited by cognition  Objective: Vital Signs: Blood pressure 119/82, pulse 98, temperature 98.2 F (36.8 C), temperature source Oral, resp. rate 20, height 5\' 7"  (1.702 m), weight 74.8 kg (165 lb), SpO2 98 %. No results found.  Recent Labs  05/24/16 0243 05/26/16 0553  WBC 6.0 9.2  HGB 8.4* 8.6*  HCT 26.9* 28.6*  PLT 365 473*    Recent Labs  05/24/16 0243 05/26/16 0553  NA 140 142  K 4.9 3.9  CL 105 105  GLUCOSE 93 95  BUN 49* 59*  CREATININE 1.24 1.25*  CALCIUM 9.5 10.1   CBG (last 3)   Recent Labs  05/25/16 0837 05/25/16 1246 05/25/16 1659  GLUCAP 122* 102* 114*    Wt Readings from Last 3 Encounters:  05/26/16 74.8 kg (165 lb)  05/25/16 78.2 kg (172 lb 8 oz)    Physical Exam:  HENT:  Head: Normocephalic.  Eyes:  Pupils sluggish to light Neck:  Tracheostomy tube in place, #6 Cardiovascular: Normal rateand regular rhythm.  Respiratory: Effort normaland breath sounds normal. Good air movement. No wheezes or rales.  GI: Soft. Bowel sounds are normal.  PEG tube intact Neurological: dysconjugate gaze. Tracks with right eye. Fol]lows simple one step commands. Understands no English Patient appears a bit restless. Exam limited by language barrier. left hemiplegia--0/5 LUE. Perhaps 1/5 Left HF. Trace resting tone LUE. Left hamstrings/gastroc 2/4 with 5-6 beats of clonus. Engages with right hand.   Assessment/Plan: 1. Functional deficits secondary to TBI which require 3+ hours per day of interdisciplinary therapy in a comprehensive inpatient rehab setting. Physiatrist is providing close team supervision and 24 hour management of active medical problems listed below. Physiatrist and rehab team continue to assess barriers to discharge/monitor patient progress toward functional and medical  goals.  Function:  Bathing Bathing position      Bathing parts      Bathing assist        Upper Body Dressing/Undressing Upper body dressing                    Upper body assist        Lower Body Dressing/Undressing Lower body dressing                                  Lower body assist        Toileting Toileting          Toileting assist     Transfers Chair/bed transfer             Locomotion Ambulation           Wheelchair          Cognition Comprehension    Expression    Social Interaction    Problem Solving    Memory     Medical Problem List and Plan: 1.  Weakness, left hemiparesthesia secondary to TBI 03/02/2016 complicated by sepsis and respiratory failure             -begin therapies  -spoke with brother regarding goals which will include simplifying his medical care, trach mgt/dc, spasticity control, equipment, family ed. 2.  DVT Prophylaxis/Anticoagulation: Subcutaneous Lovenox. Monitor for any bleeding episodes. Check vascular study on admit 3. Pain Management: Tylenol as needed  4. Tracheostomy.PMV. Follow speech therapy  -change to #4 today 5. Neuropsych: This patient is not capable of making decisions on his own behalf. 6. Skin/Wound Care: Routine skin checks 7. Fluids/Electrolytes/Nutrition: all labs personally reviewed  -increased water through tube 8. PEG tube.NPO. Follow speech therapy 9. ESBL Klebsiella in sputum culture. Contact precautions 10.  HCAP. Antibiotic therapy completed monitor for any fever 11. Gout. Colchicine  LOS (Days) 1 A FACE TO FACE EVALUATION WAS PERFORMED  SWARTZ,ZACHARY T 05/26/2016 9:01 AM
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
## 2016-05-26 NOTE — Progress Notes (Addendum)
**  Preliminary report by tech**  Bilateral lower extremity venous duplex completed. Technically limited study due to patient immobility, patient positioning, and acoustic shadowing. There is no evidence of deep or superficial vein thrombosis involving the right and left lower extremities. All visualized vessels appear patent and compressible. There is no evidence of Baker's cysts bilaterally. Incidental finding of an enlarged lymph node in the left groin.  05/26/16 2:48 PM [name] [name] RVT

## 2016-05-26 NOTE — Patient Care Conference (Signed)
Inpatient RehabilitationTeam Conference and Plan of Care Update Date: 05/26/2016   Time: 2:45 PM    Patient Name: [name] [name]      Medical Record Number: [id number]  Date of Birth: July 09, 1991 Sex: Male         Room/Bed: 4W14C/4W14C-01 Payor Info: Payor: /    Admitting Diagnosis: RES Failure  Admit Date/Time:  05/25/2016  5:45 PM Admission Comments: No comment available   Primary Diagnosis:  TBI (traumatic brain injury) (HCC) Principal Problem: TBI (traumatic brain injury) [facility])  Patient Active Problem List   Diagnosis Date Noted  . TBI (traumatic brain injury) (HCC) 05/25/2016  . Left hemiparesis (HCC) 05/25/2016  . FUO (fever of unknown origin)   . ESBL (extended spectrum beta-lactamase) producing bacteria infection   . Effusion of left knee   . Sepsis (HCC) 05/16/2016  . Tachycardia 05/16/2016  . 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 Hemiplegia (HCC) 05/16/2016  . S/P percutaneous endoscopic gastrostomy (PEG) tube placement (HCC) 05/16/2016  . Tracheostomy in place [facility]) 05/16/2016    Expected Discharge Date: Expected Discharge Date:  (2 weeks)  Team Members Present: Physician leading conference: Dr. Faith Rogue Social Worker Present: Amada Jupiter, LCSW Nurse Present: Carmie End, RN PT Present: Aleda Grana, PT;Other (comment) Judieth Keens, PT) OT Present: Roney Mans, OT;Ardis Rowan, COTA SLP Present: Feliberto Gottron, SLP PPS Coordinator present : Tora Duck, RN, CRRN     Current Status/Progress Goal Weekly Team Focus  Medical   admitted with right TBI, profound left HP and cognitive deficits. +trach and PEG  family ed/stabilize medically more for discharge  trach, nutrition, spasticity mgt, education   Bowel/Bladder   Foley resulted in urinary retention, BM incont LBM 05/26/16  Continue B/B plan as the interdisciplinary team work together implement a plan to fit the personal needs.   Continue to assist, monitor I/O, and education family    Swallow/Nutrition/ Hydration   NPO with PEG  Mod A with least restrictive diet  trials of Dys. 1 textures with use of multiple swallows per recommendaitns from FEES on 10/5.    ADL's   total A with self care/ mobility of 2 helpers, does have some L finger flexion and B knee flex/ext  mod A transfer to [facility] with slide board, min A bed mobility during self care, min A bathing bed level   family education, bed mobility, sit balance/ trunk control, UE NMR, cognition/ visual tracking   Mobility   total A, +2 for transfers and standing  mod A transfers, min A w/c mobility, mod A gait  transfers, initiation, activity tolerance, family ed   Communication   #6 cuffless trach with PMSV with full supervision, Mod-Max encouragement for verbalizations  Min A  increased verbal initiation   Safety/Cognition/ Behavioral Observations  [place] Level V-Max-Total A  Min A  attention, initiation, following commands, orientation    Pain   no pain noted facial or verbally  remain pain free  Continue to monitor and observe pain    Skin   skin intact, PEG, and trach   remain intact and remain free of infection   Continue to assess skin and monitor and implement barrier cream with needed.     Rehab Goals Patient on target to meet rehab goals: Yes *See Care Plan and progress notes for long and short-term goals.  Barriers to Discharge: profound deficits (>3 months in duration)    Possible Resolutions to Barriers:  appropriate equipment and expectations, education of family    Discharge  Planning/Teaching Needs:  Plan to d/c home with family providing 24/7 assistance  Teaching ongoing.   Team Discussion:  New eval of pt who suffered TBI several months ago in [place].  [place] to be able to get rid of trach while here.  Did better than therapies anticipated on his first day.  Tolerating passey-muir valve.  Anticipate a couple of weeks but will assess this next week.  Revisions to Treatment Plan:  None    Continued Need for Acute Rehabilitation Level of Care: The patient requires daily medical management by a physician with specialized training in physical medicine and rehabilitation for the following conditions: Daily direction of a multidisciplinary physical rehabilitation program to ensure safe treatment while eliciting the highest outcome that is of practical value to the patient.: Yes Daily medical management of patient stability for increased activity during participation in an intensive rehabilitation regime.: Yes Daily analysis of laboratory values and/or radiology reports with any subsequent need for medication adjustment of medical intervention for : Pulmonary problems;Neurological problems;Post surgical problems  [name] [name] 05/26/2016, 4:32 PM

## 2016-05-26 NOTE — Progress Notes (Signed)
RT Note: [name] [name] was changed per MD order to a #4 Cuffless Shiley per MD order. I was assisted by Lysbeth PennerMary Smith, RRT, RCP. Patients stoma appeared within normal limits and patient tolerated the procedure well with no complications. Positive color change via EtCo2 was noted as well as bilateral breath sounds and equal chest rise post trach change. Patient continues on 28% ATC with no issues and RT will continue to monitor.
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
## 2016-05-27 ENCOUNTER — Inpatient Hospital Stay (HOSPITAL_COMMUNITY): Payer: Medicaid Other | Admitting: Physical Therapy

## 2016-05-27 ENCOUNTER — Inpatient Hospital Stay (HOSPITAL_COMMUNITY): Payer: Medicaid Other | Admitting: Occupational Therapy

## 2016-05-27 ENCOUNTER — Inpatient Hospital Stay (HOSPITAL_COMMUNITY): Payer: Self-pay | Admitting: Occupational Therapy

## 2016-05-27 LAB — CULTURE, BODY FLUID W GRAM STAIN -BOTTLE

## 2016-05-27 LAB — CULTURE, BODY FLUID-BOTTLE: CULTURE: NO GROWTH

## 2016-05-27 NOTE — Progress Notes (Signed)
Physical Therapy Session Note  Patient Details  Name: [place] CraneOmar Starkel MRN: [id number] Date of Birth: 03/27/1991  Today's Date: 05/27/2016 PT Individual Time: 1300-1415 PT Individual Time Calculation (min): 75 min   Short Term Goals: Week 1:  PT Short Term Goal 1 (Week 1): Pt will consistently perform functional transfers with max A PT Short Term Goal 2 (Week 1): Pt perform sitting balance for functional task with min A  Skilled Therapeutic Interventions/Progress Updates:    Patient asleep in bed upon arrival, easily aroused to voice and light touch, with niece and interpreter present for session. Session focused on attention, initiation, and activity tolerance with functional mobility tasks including bed mobility with use of rail and increased pain with L knee flexion when attempting to have patient assist with rolling and repositioning in bed with +2A, sitting balance EOB with RUE support on rail and feet supported on 6" step with close supervision, lateral scoot transfer bed > TIS wheelchair downhill and slide board transfer TIS wheelchair > bed uphill with +2A and feet supported on step due to height of bed, and sit <> stand transfers from TIS wheelchair pulling up on sink x 2 with +2A, patient able to stand approx 30 sec first trial and 5 sec second trial before sitting due to fatigue. Patient repeatedly asking for water during session and re-educated on aspiration risk and role of SLP throughout session. Patient transitioned to therapy gym in TIS wheelchair but refused slide board transfer or other therapeutic activities due to fatigue, therefore returned to room and patient left semi reclined in bed with 4 rails up and niece present.   Therapy Documentation Precautions:  Precautions Precautions: Fall Precaution Comments: PEG and trach Restrictions Weight Bearing Restrictions: No General: PT Amount of Missed Time (min): 15 Minutes PT Missed Treatment Reason: Patient fatigue Pain: Pain  Assessment Pain Assessment: Faces Faces Pain Scale: Hurts whole lot Pain Type: Chronic pain Pain Location: Knee Pain Orientation: Left Pain Descriptors / Indicators: Aching;Grimacing Pain Onset: With Activity Pain Intervention(s): Repositioned;Rest  See Function Navigator for Current Functional Status.   Therapy/Group: Individual Therapy  Kerney ElbeVarner, Chaka Jefferys A 05/27/2016, 2:32 PM

## 2016-05-27 NOTE — Progress Notes (Signed)
Occupational Therapy Session Note  Patient Details  Name: [name] [name] MRN: [id number] Date of Birth: 12/30/1990  Today's Date: 05/27/2016 OT Individual Time: 1000-1100 OT Individual Time Calculation (min): 60 min     Short Term Goals: Week 1:  OT Short Term Goal 1 (Week 1): Pt will roll to L with mod A of 1 to assist caregivers with LB dressing.  OT Short Term Goal 2 (Week 1): Pt will roll to R with max A of 1 to assist caregivers with LB dressing. OT Short Term Goal 3 (Week 1): Pt will tolerate sitting EOB for 15 min with min A to engage in grooming activities. OT Short Term Goal 4 (Week 1): Pt will demonstrate improved motor planning with bathing with mod A bed level. OT Short Term Goal 5 (Week 1): Pt will complete slide board transfer with max A of 2 helpers.  Skilled Therapeutic Interventions/Progress Updates:    Pt seen this session to facilitate cognition of initiation, attention, motor planning with ADL retraining. Pt bathed in bed and completed more areas after cues and set up (ie holding R leg up towards pt). He demonstrated improved motor planning when handed comb and initiation to push R arm through sleeve and pull over head.  Good use of R hand to help with rolling in bed.  Used bed lift incline to work on wt bearing through legs. Placed pt at 45* for 10 min and then to 60* for 10 min.  O2 sats 100% and pt tolerating wt bearing well.  Pt actively practiced turning head to scan room to look at family, actively flexing/extending knees, and reaching with RUE.  Pt was alert and active during session. Adjusted back into supine with HOB elevated. Family in room with pt.   Therapy Documentation Precautions:  Precautions Precautions: Fall Precaution Comments: PEG and trach Restrictions Weight Bearing Restrictions: No   Pain: Pain Assessment Pain Assessment: No/denies pain ADL: ADL ADL Comments: refer to functional navigator  See Function Navigator for Current Functional  Status.   Therapy/Group: Individual Therapy  Yariah Selvey 05/27/2016, 12:57 PM

## 2016-05-27 NOTE — Progress Notes (Signed)
[place] PHYSICAL MEDICINE & REHABILITATION     PROGRESS NOTE    Subjective/Complaints: Had a good evening per brother. No breathing issues. Denies pain.   ROS limited by cognition  Objective: Vital Signs: Blood pressure 132/74, pulse 97, temperature 98.6 F (37 C), temperature source Axillary, resp. rate 18, height 5\' 7"  (1.702 m), weight 72 kg (158 lb 12.8 oz), SpO2 100 %. No results found.  Recent Labs  05/26/16 0553  WBC 9.2  HGB 8.6*  HCT 28.6*  PLT 473*    Recent Labs  05/26/16 0553  NA 142  K 3.9  CL 105  GLUCOSE 95  BUN 59*  CREATININE 1.25*  CALCIUM 10.1   CBG (last 3)   Recent Labs  05/25/16 0837 05/25/16 1246 05/25/16 1659  GLUCAP 122* 102* 114*    Wt Readings from Last 3 Encounters:  05/27/16 72 kg (158 lb 12.8 oz)  05/25/16 78.2 kg (172 lb 8 oz)    Physical Exam:  HENT:  Head: Normocephalic.  Eyes:  Pupils sluggish to light Neck:  Tracheostomy tube in place, #6 Cardiovascular: Normal rateand regular rhythm.  Respiratory: Effort normaland breath sounds normal. Good air movement. No wheezes or rales.  GI: Soft. Bowel sounds are normal.  PEG tube intact Neurological: dysconjugate gaze. Tracks with right eye. Fol]lows simple one step commands. Understands no English Patient appears a bit restless. Exam limited by language barrier. left hemiplegia--0/5 LUE. Perhaps 1/5 Left HF. Trace resting tone LUE. Left hamstrings/gastroc 2/4 with 5-6 beats of clonus. Engages with right hand.   Assessment/Plan: 1. Functional deficits secondary to TBI which require 3+ hours per day of interdisciplinary therapy in a comprehensive inpatient rehab setting. Physiatrist is providing close team supervision and 24 hour management of active medical problems listed below. Physiatrist and rehab team continue to assess barriers to discharge/monitor patient progress toward functional and medical goals.  Function:  Bathing Bathing position   Position:  Bed  Bathing parts Body parts bathed by patient: Chest, Abdomen, Left arm, Right upper leg Body parts bathed by helper: Right arm, Front perineal area, Buttocks, Left upper leg, Right lower leg, Left lower leg, Back  Bathing assist        Upper Body Dressing/Undressing Upper body dressing   What is the patient wearing?: Hospital gown                Upper body assist        Lower Body Dressing/Undressing Lower body dressing   What is the patient wearing?: Hospital Gown, Ted GladstoneHose, Non-skid slipper socks           Non-skid slipper socks- Performed by helper: Don/doff right sock, Don/doff left sock               TED Hose - Performed by helper: Don/doff right TED hose, Don/doff left TED hose  Lower body assist Assist for lower body dressing: 2 Helpers      Toileting Toileting          Toileting assist     Transfers Chair/bed transfer     Chair/bed transfer assist level: 2 helpers Chair/bed transfer assistive device: Systems developerMechanical lift Mechanical lift: Air traffic controllerMaxisky   Locomotion Ambulation Ambulation activity did not occur: Safety/medical concerns         Wheelchair          Cognition Comprehension Comprehension assist level: Understands basic 50 - 74% of the time/ requires cueing 25 - 49% of the time  Expression Expression assist level:  Expresses basic 25 - 49% of the time/requires cueing 50 - 75% of the time. Uses single words/gestures.  Social Interaction Social Interaction assist level: Interacts appropriately 25 - 49% of time - Needs frequent redirection.  Problem Solving Problem solving assist level: Solves basic 25 - 49% of the time - needs direction more than half the time to initiate, plan or complete simple activities  Memory Memory assist level: Recognizes or recalls 25 - 49% of the time/requires cueing 50 - 75% of the time   Medical Problem List and Plan: 1.  Weakness, left hemiparesthesia secondary to TBI 03/02/2016 complicated by sepsis and  respiratory failure             -continue therapies 2.  DVT Prophylaxis/Anticoagulation: Subcutaneous Lovenox.  -vascular studies are normal 3. Pain Management: Tylenol as needed 4. Tracheostomy.PMV. Follow speech therapy  -changed to #4 trach 5. Neuropsych: This patient is not capable of making decisions on his own behalf. 6. Skin/Wound Care: Routine skin checks 7. Fluids/Electrolytes/Nutrition: all labs personally reviewed  -increased water through tube 8. PEG tube.NPO. Follow speech therapy 9. ESBL Klebsiella in sputum culture. Contact precautions 10.  HCAP. Antibiotic therapy completed monitor for any fever 11. Gout. Colchicine  LOS (Days) 2 A FACE TO FACE EVALUATION WAS PERFORMED  [name] [name] T 05/27/2016 8:34 AM

## 2016-05-27 NOTE — Progress Notes (Signed)
Speech Language Pathology Daily Session Note  Patient Details  Name: Nevada CraneOmar Smotherman MRN: [id number] Date of Birth: 02/22/1991  Today's Date: 05/27/2016 SLP Individual Time: 1100-1200 SLP Individual Time Calculation (min): 60 min   Short Term Goals: Week 1: SLP Short Term Goal 1 (Week 1): Patient will consume trials of Dys. 1 textures without overt s/s of aspiration with Mod A verbal cues for use of swallowing compensatory strateiges over 3 sessions prior to repeat MBS.  SLP Short Term Goal 2 (Week 1): Patient will verbally answer questions in 75% of opportunties with Min A verbal cues.  SLP Short Term Goal 3 (Week 1): Patient will utilize speech intelligibility strategies at the phrase level with Mod A verbal cues to achieve 90% intelligibility. SLP Short Term Goal 4 (Week 1): Patient will wear PMSV with full supervision with all vitals remaining WFL.  SLP Short Term Goal 5 (Week 1): Patient will demonstrate sustained attention to tasks for 15 minutes with Mod A verbal cues for redirection.  SLP Short Term Goal 6 (Week 1): Patient will initiate functional tasks with Mod A verbal cues.   Skilled Therapeutic Interventions: Skilled treatment session focused on dysphagia and cognitive goals. Upon arrival, PMSV was in place with all vitals remaining [facility] session. Patient with limited verbal expression and required Max A verbal cues to verbally respond to questions at the word level. Patient was ~75% intelligibility due to decreased vocal intensity and generalized weakness. While sitting edge of bed, patient self-fed trials of ice chips with subtle throat clear X 2 and Dys. 1 textures with subtle throat clear and extra time and Mod A verbal cues needed for use of multiple swallows. Throughout trials, patient demonstrated impaired AP transit what appeared to be delayed swallow initiation. Patient self-feed trials with Mod A verbal, tactile and visual cues due to impaired motor planning and  visual-perceptual deficits but required Min A visual cues for initiation. Patient's family present and provided education in regards to patient's current swallowing function and aspiration risk, all verbalized understanding but will need reinforcement. Patient left supine in bed with all needs within reach and family present. Continue with current plan of care.   Function:  Eating Eating   Modified Consistency Diet: Yes Eating Assist Level: More than reasonable amount of time;Supervision or verbal cues;Help with picking up utensils (with trials from SLP )           Cognition Comprehension Comprehension assist level: Understands basic 50 - 74% of the time/ requires cueing 25 - 49% of the time  Expression Expression assistive device: Talk trach valve Expression assist level: Expresses basic 25 - 49% of the time/requires cueing 50 - 75% of the time. Uses single words/gestures.  Social Interaction Social Interaction assist level: Interacts appropriately 25 - 49% of time - Needs frequent redirection.  Problem Solving Problem solving assist level: Solves basic 25 - 49% of the time - needs direction more than half the time to initiate, plan or complete simple activities  Memory Memory assist level: Recognizes or recalls 25 - 49% of the time/requires cueing 50 - 75% of the time    Pain Pain Assessment Pain Assessment: No/denies pain  Therapy/Group: Individual Therapy  Ronda Kazmi 05/27/2016, 1:29 PM

## 2016-05-27 NOTE — Progress Notes (Signed)
Social Work Patient ID: [name] CraneOmar Hinger, male   DOB: 04/12/1991, 25 y.o.   MRN: [id number]   Have reviewed team conference with pt, sister and family.  They are aware that team set targeted LOS of 2 weeks and hope that will be able to get rid of trach by time of d/c.  Confirm plan that pt will for pt to d/c to his sister's home with several family members providing the 24/7 care.  Continue to follow.  Sade Mehlhoff, LCSW

## 2016-05-28 ENCOUNTER — Inpatient Hospital Stay (HOSPITAL_COMMUNITY): Payer: Self-pay | Admitting: Physical Therapy

## 2016-05-28 ENCOUNTER — Inpatient Hospital Stay (HOSPITAL_COMMUNITY): Payer: Medicaid Other | Admitting: Occupational Therapy

## 2016-05-28 ENCOUNTER — Inpatient Hospital Stay (HOSPITAL_COMMUNITY): Payer: Medicaid Other | Admitting: Speech Pathology

## 2016-05-28 MED ORDER — BACLOFEN 5 MG HALF TABLET
5.0000 mg | ORAL_TABLET | Freq: Two times a day (BID) | ORAL | Status: DC
  Administered 2016-05-28 – 2016-06-04 (×15): 5 mg
  Filled 2016-05-28 (×16): qty 1

## 2016-05-28 MED ORDER — PRO-STAT SUGAR FREE PO LIQD
30.0000 mL | Freq: Three times a day (TID) | ORAL | Status: DC
  Administered 2016-05-28 – 2016-06-03 (×18): 30 mL
  Filled 2016-05-28 (×17): qty 30

## 2016-05-28 MED ORDER — ORAL CARE MOUTH RINSE
15.0000 mL | Freq: Two times a day (BID) | OROMUCOSAL | Status: DC
  Administered 2016-05-28 – 2016-06-18 (×32): 15 mL via OROMUCOSAL

## 2016-05-28 MED ORDER — PANTOPRAZOLE SODIUM 40 MG PO PACK
40.0000 mg | PACK | Freq: Every day | ORAL | Status: DC
  Administered 2016-05-28 – 2016-06-17 (×21): 40 mg
  Filled 2016-05-28 (×12): qty 20

## 2016-05-28 MED ORDER — CHLORHEXIDINE GLUCONATE 0.12 % MT SOLN
15.0000 mL | Freq: Two times a day (BID) | OROMUCOSAL | Status: DC
  Administered 2016-05-28 – 2016-06-04 (×13): 15 mL via OROMUCOSAL
  Filled 2016-05-28 (×14): qty 15

## 2016-05-28 MED ORDER — JEVITY 1.5 CAL/FIBER PO LIQD
350.0000 mL | Freq: Four times a day (QID) | ORAL | Status: DC
  Administered 2016-05-28: 100 mL
  Administered 2016-05-29 (×2): 200 mL
  Administered 2016-05-29: 300 mL
  Administered 2016-05-29 – 2016-06-03 (×18): 350 mL
  Filled 2016-05-28 (×31): qty 1000

## 2016-05-28 NOTE — Care Management Note (Signed)
[facility] Individual Statement of Services  Patient Name:  [place] CraneOmar Kertesz  Date:  05/28/2016  Welcome to the [facility].  Our goal is to provide you with an individualized program based on your diagnosis and situation, designed to meet your specific needs.  With this comprehensive rehabilitation program, you will be expected to participate in at least 3 hours of rehabilitation therapies Monday-Friday, with modified therapy programming on the weekends.  Your rehabilitation program will include the following services:  Physical Therapy (PT), Occupational Therapy (OT), Speech Therapy (ST), 24 hour per day rehabilitation nursing, Therapeutic Recreaction (TR), Neuropsychology, Case Management (Social Worker), Rehabilitation Medicine, Nutrition Services and Pharmacy Services  Weekly team conferences will be held on Tuesdays to discuss your progress.  Your Social Worker will talk with you frequently to get your input and to update you on team discussions.  Team conferences with you and your family in attendance may also be held.  Expected length of stay: 2 weeks+  Overall anticipated outcome: min/ max assistance  Depending on your progress and recovery, your program may change. Your Social Worker will coordinate services and will keep you informed of any changes. Your Social Worker's name and contact numbers are listed  below.  The following services may also be recommended but are not provided by the [facility]patient [facility]:   Driving Evaluations  Home Health Rehabiltiation Services  Outpatient Rehabilitation Services  Vocational Rehabilitation   Arrangements will be made to provide these services after discharge if needed.  Arrangements include referral to agencies that provide these services.  Your insurance has been verified to be:  Medicaid application pending Your primary doctor is:  None  Pertinent information will be shared with your  doctor and your insurance company.  Social Worker:  Valley BrookLucy Shawntina Diffee, [place] [phone] or [phone]) [phone]   Information discussed with and copy given to patient by: Amada JupiterHOYLE, Rosalva Neary, 05/28/2016, 2:56 PM
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
## 2016-05-28 NOTE — Progress Notes (Signed)
Physical Therapy Note  Patient Details  Name: [name] [name] MRN: [id number] Date of Birth: 07/24/1991 Today's Date: 05/28/2016    Time: 1300-1350 50 minutes  1:1 Pt with no c/o pain during session. Pt max A for bed mobility and mod A for balance edge of bed.  +2 assist for transfer to w/c with pt performing 25%.  Session focused on gait training with EVA walker. Pt able to gait 2', 8' x 2 with EVA walker and +2 assist for safety. Pt requires max manual facilitation for wt shifts, upright posture and total A for Lt LE advancement. Pt states that walking was "good". Pt very fatigued after gait trials. PT encouraged pt to stay up in w/c but pt asks to return to bed. Pt performed transfer to bed with +2 assist for safety. Pt/family educated on importance of out of bed time and that we will continue to encourage time out of bed even when not in therapy.   Zoi Devine 05/28/2016, 1:50 PM

## 2016-05-28 NOTE — Progress Notes (Signed)
Occupational Therapy Session Note  Patient Details  Name: [name] [name] MRN: [id number] Date of Birth: 07-20-1991  Today's Date: 05/28/2016 OT Individual Time: 1000-1100 OT Individual Time Calculation (min): 60 min     Short Term Goals:Week 1:  OT Short Term Goal 1 (Week 1): Pt will roll to L with mod A of 1 to assist caregivers with LB dressing.  OT Short Term Goal 2 (Week 1): Pt will roll to R with max A of 1 to assist caregivers with LB dressing. OT Short Term Goal 3 (Week 1): Pt will tolerate sitting EOB for 15 min with min A to engage in grooming activities. OT Short Term Goal 4 (Week 1): Pt will demonstrate improved motor planning with bathing with mod A bed level. OT Short Term Goal 5 (Week 1): Pt will complete slide board transfer with max A of 2 helpers.  Skilled Therapeutic Interventions/Progress Updates:    Pt seen to facilitate cognition of attention/ initiation, motor planning, sitting balance, use of BLE with sit to stand. Pt in bed and stated he needed to toilet, excellent initiation to roll and sit to EOB with only mod A of 1.  Max squat pivot to w/c with 2 helpers.  Max of 2 helpers to toilet. Good sitting balance on toilet.  Sit to stand from toilet very difficult with low surface requiring total A. (elevated seat brought to room to try next time).  Pt demonstrated good initiation with reaching for grab bar, leaning forward with good anticipation of what he needed to do. Pt successfully had BM, returned to w/c. Positioned in front of sink to bathe and dress. Improved motor planning with R hand with minimal perseveration noted.  Pt actively tried to wash each area that he was cued to do so.  Donned briefs and pants over feet with max A and then stood to sink with max A of 2. Once in standing improved trunk extension, B knees supported and pt held posture with mod A. Only 30 sec tolerance, rested and then repeated another 30 sec. Pt adjusted in w/c with all needs met and family in room  with patient.  Therapy Documentation Precautions:  Precautions Precautions: Fall Precaution Comments: PEG and trach Restrictions Weight Bearing Restrictions: No    Vital Signs: Therapy Vitals Temp: 97.9 F (36.6 C) Temp Source: Axillary Pulse Rate: (!) 105 Resp: 18 BP: 120/78 Patient Position (if appropriate): Lying Oxygen Therapy SpO2: 100 % O2 Device: Tracheostomy Collar O2 Flow Rate (L/min): 5 L/min FiO2 (%): 28 % Pain:  no c/o pain ADL: ADL ADL Comments: refer to functional navigator  See Function Navigator for Current Functional Status.   Therapy/Group: Individual Therapy  Winola Drum 05/28/2016, 9:27 AM
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
## 2016-05-28 NOTE — Progress Notes (Signed)
Pt trach capped per MD order. Spanish Translator at bedside explaining the procedure to pt and family. Pt with no complications. Pt taken off trach collar and left on room air. Pt with weak non productive cough and able to speak. RT will continue to monitor.
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
## 2016-05-28 NOTE — Progress Notes (Signed)
Speech Language Pathology Daily Session Note  Patient Details  Name: [name] [name] MRN: [id number] Date of Birth: 03/03/1991  Today's Date: 05/28/2016 SLP Individual Time: 1105-1200 SLP Individual Time Calculation (min): 55 min   Short Term Goals: Week 1: SLP Short Term Goal 1 (Week 1): Patient will consume trials of Dys. 1 textures without overt s/s of aspiration with Mod A verbal cues for use of swallowing compensatory strateiges over 3 sessions prior to repeat MBS.  SLP Short Term Goal 2 (Week 1): Patient will verbally answer questions in 75% of opportunties with Min A verbal cues.  SLP Short Term Goal 3 (Week 1): Patient will utilize speech intelligibility strategies at the phrase level with Mod A verbal cues to achieve 90% intelligibility. SLP Short Term Goal 4 (Week 1): Patient will wear PMSV with full supervision with all vitals remaining WFL.  SLP Short Term Goal 5 (Week 1): Patient will demonstrate sustained attention to tasks for 15 minutes with Mod A verbal cues for redirection.  SLP Short Term Goal 6 (Week 1): Patient will initiate functional tasks with Mod A verbal cues.   Skilled Therapeutic Interventions:Skilled therapy intervention focused on cognitive and dysphagia goals. Upon arrival, patient's PMSV was in place and vitals WFL.Patient was disoriented to city, oriented to month, and independently utilized external aids to recall the date. Patient independently recalled the names of family in the room and his relationship to both of them. Patient followed two-step directions with Mod I while participating in social conversation and structured therapeutic activities. Patient was 100% accurate in structured activity where he was asked to match in a field of nine. Given a money sorting and counting task, patient required Mod A verbal and visual cues for selective attention and problem solving to achieve total accuracy. Patient required Min A verbal cues for initiation of voicing  throughout session. Patient displayed no overt s/s of aspiration during trials of ice chips. Patient left upright in wheelchair with family in room and all needs within reach. Continue current plan of care.    Function:  Eating Eating   Modified Consistency Diet: Yes Eating Assist Level: Set up assist for;Supervision or verbal cues;Helper feeds patient with trials from SLP   Eating Set Up Assist For: Opening containers Helper Scoops Food on Utensil: Every scoop Helper Brings Food to Mouth: Every scoop   Cognition Comprehension Comprehension assist level: Understands basic 75 - 89% of the time/ requires cueing 10 - 24% of the time  Expression Expression assistive device: Talk trach valve Expression assist level: Expresses basic 25 - 49% of the time/requires cueing 50 - 75% of the time. Uses single words/gestures.  Social Interaction Social Interaction assist level: Interacts appropriately 25 - 49% of time - Needs frequent redirection.  Problem Solving Problem solving assist level: Solves basic 50 - 74% of the time/requires cueing 25 - 49% of the time  Memory Memory assist level: Recognizes or recalls 25 - 49% of the time/requires cueing 50 - 75% of the time    Pain Pain Assessment Pain Assessment: No/denies pain  Therapy/Group: Individual Therapy  Caryn Sectionlison Sherre Wooton 05/28/2016, 12:43 PM

## 2016-05-28 NOTE — Progress Notes (Signed)
[name] [name] PHYSICAL MEDICINE & REHABILITATION     PROGRESS NOTE    Subjective/Complaints: Pt resting comfortably. Pt/family report no problems overnight. Denies pain   ROS limited by cognition  Objective: Vital Signs: Blood pressure 120/78, pulse (!) 105, temperature 97.9 F (36.6 C), temperature source Axillary, resp. rate 18, height 5\' 7"  (1.702 m), weight 72 kg (158 lb 11.7 oz), SpO2 100 %. No results found.  Recent Labs  05/26/16 0553  WBC 9.2  HGB 8.6*  HCT 28.6*  PLT 473*    Recent Labs  05/26/16 0553  NA 142  K 3.9  CL 105  GLUCOSE 95  BUN 59*  CREATININE 1.25*  CALCIUM 10.1   CBG (last 3)   Recent Labs  05/25/16 1246 05/25/16 1659  GLUCAP 102* 114*    Wt Readings from Last 3 Encounters:  05/28/16 72 kg (158 lb 11.7 oz)  05/25/16 78.2 kg (172 lb 8 oz)    Physical Exam:  HENT:  Head: Normocephalic.  Eyes:  Pupils sluggish to light Neck:  Tracheostomy tube in place, #6 Cardiovascular: Normal rateand regular rhythm.  Respiratory: Effort normaland breath sounds normal. Good air movement. No wheezes or rales.  GI: Soft. Bowel sounds are normal.  PEG tube intact Neurological: dysconjugate gaze. Tracks with right eye. Fol]lows simple one step commands. Understands no English Patient appears a bit restless. Exam limited by language barrier. left hemiplegia--0/5 LUE. Perhaps 1/5 Left HF. Trace resting tone LUE. Left hamstrings/gastroc 2/4 with 5-6 beats of clonus. Engages with right hand.   Assessment/Plan: 1. Functional deficits secondary to TBI which require 3+ hours per day of interdisciplinary therapy in a comprehensive inpatient rehab setting. Physiatrist is providing close team supervision and 24 hour management of active medical problems listed below. Physiatrist and rehab team continue to assess barriers to discharge/monitor patient progress toward functional and medical goals.  Function:  Bathing Bathing position   Position: Bed   Bathing parts Body parts bathed by patient: Chest, Abdomen, Left arm, Right upper leg, Front perineal area, Right lower leg Body parts bathed by helper: Right arm, Buttocks, Left upper leg, Left lower leg, Back  Bathing assist        Upper Body Dressing/Undressing Upper body dressing   What is the patient wearing?: Pull over shirt/dress     Pull over shirt/dress - Perfomed by patient: Put head through opening, Thread/unthread right sleeve Pull over shirt/dress - Perfomed by helper: Thread/unthread left sleeve, Pull shirt over trunk        Upper body assist        Lower Body Dressing/Undressing Lower body dressing   What is the patient wearing?: Pants, Non-skid slipper socks, Ted Hose     Pants- Performed by patient: Thread/unthread right pants leg Pants- Performed by helper: Thread/unthread left pants leg, Pull pants up/down   Non-skid slipper socks- Performed by helper: Don/doff right sock, Don/doff left sock               TED Hose - Performed by helper: Don/doff right TED hose, Don/doff left TED hose  Lower body assist Assist for lower body dressing: 2 Helpers      Toileting Toileting          Toileting assist     Transfers Chair/bed transfer   Chair/bed transfer method: Lateral scoot Chair/bed transfer assist level: 2 helpers Chair/bed transfer assistive device: Systems developerMechanical lift Mechanical lift: Air traffic controllerMaxisky   Locomotion Ambulation Ambulation activity did not occur: Safety/medical concerns  Wheelchair   Type: Manual (TIS )   Assist Level: Dependent (Pt equals 0%)  Cognition Comprehension Comprehension assist level: Understands basic 50 - 74% of the time/ requires cueing 25 - 49% of the time  Expression Expression assist level: Expresses basic 25 - 49% of the time/requires cueing 50 - 75% of the time. Uses single words/gestures.  Social Interaction Social Interaction assist level: Interacts appropriately 25 - 49% of time - Needs frequent  redirection.  Problem Solving Problem solving assist level: Solves basic 25 - 49% of the time - needs direction more than half the time to initiate, plan or complete simple activities  Memory Memory assist level: Recognizes or recalls 25 - 49% of the time/requires cueing 50 - 75% of the time   Medical Problem List and Plan: 1.  Weakness, left hemiparesthesia secondary to TBI 03/02/2016 complicated by sepsis and respiratory failure             -continue therapies 2.  DVT Prophylaxis/Anticoagulation: Subcutaneous Lovenox.  -vascular studies are normal 3. Pain Management: Tylenol as needed 4. Tracheostomy.PMV. Follow speech therapy  -plug #4 trach today 5. Neuropsych: This patient is not capable of making decisions on his own behalf. 6. Skin/Wound Care: Routine skin checks 7. Fluids/Electrolytes/Nutrition: all labs personally reviewed  -increased water through tube 8. PEG tube.NPO. Follow speech therapy 9. ESBL Klebsiella in sputum culture. Contact precautions 10.  HCAP. Antibiotic therapy completed monitor for any fever 11. Gout. 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

## 2016-05-29 ENCOUNTER — Inpatient Hospital Stay (HOSPITAL_COMMUNITY): Payer: Medicaid Other | Admitting: Speech Pathology

## 2016-05-29 ENCOUNTER — Inpatient Hospital Stay (HOSPITAL_COMMUNITY): Payer: Self-pay | Admitting: Physical Therapy

## 2016-05-29 ENCOUNTER — Inpatient Hospital Stay (HOSPITAL_COMMUNITY): Payer: Medicaid Other | Admitting: Occupational Therapy

## 2016-05-29 DIAGNOSIS — G8114 Spastic hemiplegia affecting left nondominant side: Secondary | ICD-10-CM

## 2016-05-29 LAB — BASIC METABOLIC PANEL
ANION GAP: 13 (ref 5–15)
BUN: 55 mg/dL — ABNORMAL HIGH (ref 6–20)
CALCIUM: 10.4 mg/dL — AB (ref 8.9–10.3)
CO2: 27 mmol/L (ref 22–32)
CREATININE: 1.34 mg/dL — AB (ref 0.61–1.24)
Chloride: 103 mmol/L (ref 101–111)
Glucose, Bld: 84 mg/dL (ref 65–99)
Potassium: 4 mmol/L (ref 3.5–5.1)
SODIUM: 143 mmol/L (ref 135–145)

## 2016-05-29 MED ORDER — FREE WATER
250.0000 mL | Freq: Every day | Status: DC
  Administered 2016-05-29 – 2016-06-04 (×28): 250 mL

## 2016-05-29 MED ORDER — GABAPENTIN 100 MG PO CAPS
100.0000 mg | ORAL_CAPSULE | Freq: Three times a day (TID) | ORAL | Status: DC
  Administered 2016-05-29 – 2016-05-31 (×6): 100 mg via ORAL
  Filled 2016-05-29 (×6): qty 1

## 2016-05-29 NOTE — Progress Notes (Signed)
Physical Therapy Session Note  Patient Details  Name: [name] [name] MRN: [id number] Date of Birth: 01-21-1991  Today's Date: 05/29/2016 PT Individual Time:   4854- 6270 PT individual time calculation.  75      Short Term Goals: Week 1:  PT Short Term Goal 1 (Week 1): Pt will consistently perform functional transfers with max A PT Short Term Goal 2 (Week 1): Pt perform sitting balance for functional task with min A  Skilled Therapeutic Interventions/Progress Updates:     PT treatment focused on sustained attention, transfers and sitting balance. Patient received supine in bed and aroused with effort, minimally responsive to sternal rub and voal stimuli. Responsive and able to arouse to cold towel on face.  Supine>sit with max Assist from PT. Sitting balance EOB with mod-max Assist. SB transfer with max Assist from PT to [facility] with step under feet. PT transports patient to rehab gym in [place]. SB transfer to mat table with Max Assist and max cues for improved posture and increased use of the RUE and BLE. Sitting balance x 30 minutes with 3  Rest breaks of 5 min each. Min-mod assist with sitting for improved posture, head control and UE positioning as well as max cues for improved BLE support. Patient required cues every 2 minutes for head control and improved LE positioning. Lateral weight shifts in sitting to R and L 2x 2 minutes. With mod Assist from PT to improve trunk elongation on the L and prevent LOB  Gait trianing x 59f with max +2 with EHarmon Pierwalker. Max Cues and facilitation for increased head control, posture, step length and positoining on the LLE. PT provided only tactile support at knee of LLE to prevent buckling. Patient reports that he is very tired following gait trianing Patient returned [facility]       Therapy Documentation Precautions:  Precautions Precautions: Fall Precaution Comments: PEG and trach Restrictions Weight Bearing  Restrictions: No General:   Missed time: 15 minutes (pt fatigue)  Vital Signs: Therapy Vitals Pulse Rate: 86 Resp: 18 Patient Position (if appropriate): Lying Oxygen Therapy SpO2: 95 % O2 Device: Not Delivered Pain: Pain Assessment Pain Assessment: No/denies pain   See Function Navigator for Current Functional Status.   Therapy/Group: Individual Therapy  ALorie Phenix10/13/2017, 2:48 PM

## 2016-05-29 NOTE — Progress Notes (Signed)
Speech Language Pathology Daily Session Note  Patient Details  Name: [name] [name] MRN: [id number] Date of Birth: 07/15/1991  Today's Date: 05/29/2016 SLP Individual Time: 1000-1100 SLP Individual Time Calculation (min): 60 min   Short Term Goals: Week 1: SLP Short Term Goal 1 (Week 1): Patient will consume trials of Dys. 1 textures without overt s/s of aspiration with Mod A verbal cues for use of swallowing compensatory strateiges over 3 sessions prior to repeat MBS.  SLP Short Term Goal 2 (Week 1): Patient will verbally answer questions in 75% of opportunties with Min A verbal cues.  SLP Short Term Goal 3 (Week 1): Patient will utilize speech intelligibility strategies at the phrase level with Mod A verbal cues to achieve 90% intelligibility. SLP Short Term Goal 4 (Week 1): Patient will wear PMSV with full supervision with all vitals remaining WFL.  SLP Short Term Goal 5 (Week 1): Patient will demonstrate sustained attention to tasks for 15 minutes with Mod A verbal cues for redirection.  SLP Short Term Goal 6 (Week 1): Patient will initiate functional tasks with Mod A verbal cues.   Skilled Therapeutic Interventions:Skilled therapy intervention focused on speech and dysphagia goals. Patient independently recalled the names of family in the room and his relationship to both of them. Patient required mod A of verbal/visual cueing and model to demonstrate adequate voicing throughout session. Able to generate speech at the sentence level with mod A and reasonable intelligibility within context.  Patient displayed no overt s/s of aspiration during trials of ice chips and yogurt. Intermittent verbal and tactile cueing to elicit double swallows with each bolus. Patient requested return to bed at conclusion of session secondary to fatigue.   Function:  Eating Eating     Eating Assist Level: Set up assist for;Supervision or verbal cues;Helper scoops food on utensil   Eating Set Up Assist For:  Opening containers Helper Scoops Food on Utensil: Every scoop     Cognition Comprehension Comprehension assist level: Understands basic 75 - 89% of the time/ requires cueing 10 - 24% of the time  Expression Expression assistive device:  (trach capped) Expression assist level: Expresses basic 25 - 49% of the time/requires cueing 50 - 75% of the time. Uses single words/gestures.  Social Interaction Social Interaction assist level: Interacts appropriately 50 - 74% of the time - May be physically or verbally inappropriate.  Problem Solving Problem solving assist level: Solves basic 25 - 49% of the time - needs direction more than half the time to initiate, plan or complete simple activities  Memory Memory assist level: Recognizes or recalls 25 - 49% of the time/requires cueing 50 - 75% of the time    Pain Pain Assessment Pain Assessment: No/denies pain  Therapy/Group: Individual Therapy  Rocky CraftsKara E Shellyann Wandrey MA, CCC-SLP 05/29/2016, 12:35 PM

## 2016-05-29 NOTE — Progress Notes (Signed)
Occupational Therapy Session Note  Patient Details  Name: [name] CraneOmar Cromwell MRN: [id number] Date of Birth: 10/20/1990  Today's Date: 05/29/2016 OT Individual Time: 1000-1100 OT Individual Time Calculation (min): 60 min     Short Term Goals: Week 1:  OT Short Term Goal 1 (Week 1): Pt will roll to L with mod A of 1 to assist caregivers with LB dressing.  OT Short Term Goal 2 (Week 1): Pt will roll to R with max A of 1 to assist caregivers with LB dressing. OT Short Term Goal 3 (Week 1): Pt will tolerate sitting EOB for 15 min with min A to engage in grooming activities. OT Short Term Goal 4 (Week 1): Pt will demonstrate improved motor planning with bathing with mod A bed level. OT Short Term Goal 5 (Week 1): Pt will complete slide board transfer with max A of 2 helpers.  Skilled Therapeutic Interventions/Progress Updates:   Pt seen for facilitation of cognition, perception, functional mobility with ADL retraining. Completed bed mobility mod A. +2 max A squat pivot bed to roll in shower chair. Trach cover and IV cover placed. Pt rolled into shower and he took good initiation of bathing areas he could reach. Due to seat position he had more difficulty reaching R foot today. Dried off with max A and then transferred back to w/c for dressing. This transfer, pt assisted more by using R hand to push body wt up.  Mod A UB, max pants with mod cues to focus on task as pt was very distracted by itching on back. Lotion and antifungal powder applied.  Sit to stand at sink with mod A of 2 for support. Pt used R hand to push self into standing. Stood for 30 seconds for therapists to adjust clothing over hips.  Pt vocalizing he was tired. Responded yes that he did like the shower. Pt in chair with family to assist with oral care prior to speech therapy.  Therapy Documentation Precautions:  Precautions Precautions: Fall Precaution Comments: PEG and trach Restrictions Weight Bearing Restrictions: No      Pain: Pain Assessment Pain Assessment: No/denies pain ADL: ADL ADL Comments: refer to functional navigator  See Function Navigator for Current Functional Status.   Therapy/Group: Individual Therapy  Sang Blount 05/29/2016, 12:29 PM

## 2016-05-29 NOTE — Progress Notes (Signed)
[facility] PHYSICAL MEDICINE & REHABILITATION     PROGRESS NOTE    Subjective/Complaints: Pt resting. States he's having tingling/burning pain in left arm  ROS limited by cognition  Objective: Vital Signs: Blood pressure (!) 145/97, pulse 78, temperature 98.5 F (36.9 C), temperature source Oral, resp. rate 16, height 5\' 7"  (1.702 m), weight 72.6 kg (160 lb), SpO2 97 %. No results found. No results for input(s): WBC, HGB, HCT, PLT in the last 72 hours.  Recent Labs  05/29/16 0804  NA 143  K 4.0  CL 103  GLUCOSE 84  BUN 55*  CREATININE 1.34*  CALCIUM 10.4*   CBG (last 3)  No results for input(s): GLUCAP in the last 72 hours.  Wt Readings from Last 3 Encounters:  05/29/16 72.6 kg (160 lb)  05/25/16 78.2 kg (172 lb 8 oz)    Physical Exam:  HENT:  Head: Normocephalic.  Eyes:  Pupils sluggish to light Neck:  Tracheostomy tube in place, #4 Cardiovascular: Normal rateand regular rhythm.  Respiratory: Effort normaland breath sounds normal. Good air movement. No wheezes or rales.  GI: Soft. Bowel sounds are normal.  PEG tube intact Neurological: dysconjugate gaze. Tracks with right eye. Fol]lows simple one step commands. Understands no English Patient appears a bit restless. Exam limited by language barrier. left hemiplegia--0/5 LUE. Perhaps 1/5 Left HF. 1/4 resting tone LUE. Left hamstrings/gastroc 2/4 with 5-6 beats of clonus. Engages with right hand.   Assessment/Plan: 1. Functional deficits secondary to TBI which require 3+ hours per day of interdisciplinary therapy in a comprehensive inpatient rehab setting. Physiatrist is providing close team supervision and 24 hour management of active medical problems listed below. Physiatrist and rehab team continue to assess barriers to discharge/monitor patient progress toward functional and medical goals.  Function:  Bathing Bathing position   Position: Wheelchair/chair at sink  Bathing parts Body parts bathed by  patient: Chest, Abdomen, Left arm, Right upper leg, Front perineal area, Right lower leg, Left upper leg Body parts bathed by helper: Right arm, Buttocks, Left lower leg, Back  Bathing assist Assist Level: 2 helpers      Upper Body Dressing/Undressing Upper body dressing   What is the patient wearing?: Pull over shirt/dress     Pull over shirt/dress - Perfomed by patient: Put head through opening, Thread/unthread right sleeve Pull over shirt/dress - Perfomed by helper: Thread/unthread left sleeve, Pull shirt over trunk        Upper body assist        Lower Body Dressing/Undressing Lower body dressing   What is the patient wearing?: Pants, Ted Hose, Shoes     Pants- Performed by patient: Thread/unthread right pants leg Pants- Performed by helper: Thread/unthread left pants leg, Pull pants up/down   Non-skid slipper socks- Performed by helper: Don/doff right sock, Don/doff left sock       Shoes - Performed by helper: Don/doff right shoe, Don/doff left shoe, Fasten right, Fasten left       TED Hose - Performed by helper: Don/doff right TED hose, Don/doff left TED hose  Lower body assist Assist for lower body dressing: 2 Helpers      Toileting Toileting     Toileting steps completed by helper: Adjust clothing prior to toileting, Performs perineal hygiene, Adjust clothing after toileting    Toileting assist Assist level: Two helpers   Transfers Chair/bed transfer   Chair/bed transfer method: Squat pivot Chair/bed transfer assist level: 2 helpers Chair/bed transfer assistive device: Mechanical lift Mechanical lift: [employer]  Locomotion Ambulation Ambulation activity did not occur: Safety/medical concerns   Max distance: 8 Assist level: 2 helpers   Wheelchair   Type: Manual (TIS )   Assist Level: Dependent (Pt equals 0%)  Cognition Comprehension Comprehension assist level: Understands basic 75 - 89% of the time/ requires cueing 10 - 24% of the time  Expression  Expression assist level: Expresses basic 25 - 49% of the time/requires cueing 50 - 75% of the time. Uses single words/gestures.  Social Interaction Social Interaction assist level: Interacts appropriately 25 - 49% of time - Needs frequent redirection.  Problem Solving Problem solving assist level: Solves basic 50 - 74% of the time/requires cueing 25 - 49% of the time  Memory Memory assist level: Recognizes or recalls 25 - 49% of the time/requires cueing 50 - 75% of the time   Medical Problem List and Plan: 1.  Weakness, left hemiparesthesia secondary to TBI 03/02/2016 complicated by sepsis and respiratory failure             -continue therapies---actually showing some progress 2.  DVT Prophylaxis/Anticoagulation: Subcutaneous Lovenox.  -vascular studies are normal 3. Pain Management: Tylenol as needed  -add low dose gabapentin for neuropathic pain 4. Tracheostomy.PMV. Follow speech therapy  -#4 trach capped 5. Neuropsych: This patient is not capable of making decisions on his own behalf. 6. Skin/Wound Care: Routine skin checks 7. Fluids/Electrolytes/Nutrition: all labs personally reviewed  -increase water through tube to  8. PEG tube.NPO. Progress per SLP 9. ESBL Klebsiella in sputum culture. Contact precautions 10.  HCAP. Antibiotic therapy completed monitor for any fever 11. Gout. 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

## 2016-05-30 ENCOUNTER — Inpatient Hospital Stay (HOSPITAL_COMMUNITY): Payer: Medicaid Other

## 2016-05-30 ENCOUNTER — Inpatient Hospital Stay (HOSPITAL_COMMUNITY): Payer: Self-pay | Admitting: Physical Therapy

## 2016-05-30 ENCOUNTER — Inpatient Hospital Stay (HOSPITAL_COMMUNITY): Payer: Medicaid Other | Admitting: Speech Pathology

## 2016-05-30 ENCOUNTER — Inpatient Hospital Stay (HOSPITAL_COMMUNITY): Payer: Medicaid Other | Admitting: Occupational Therapy

## 2016-05-30 NOTE — Progress Notes (Signed)
Physical Therapy Session Note  Patient Details  Name: [name] [name] MRN: [id number] Date of Birth: 1991-04-29  Today's Date: 05/30/2016 PT Individual Time:1030-1130 PT Individual Time Calculation: 60 min       Short Term Goals: Week 1:  PT Short Term Goal 1 (Week 1): Pt will consistently perform functional transfers with max A PT Short Term Goal 2 (Week 1): Pt perform sitting balance for functional task with min A  Skilled Therapeutic Interventions/Progress Updates:     Patient received sitting in  TIS WC and agreeable to PT with translator present. But reports significant fatigue due to bathing earlier in the day.  Patient transported to gym and performed 4 bouts of reciprocal stepping while seated in WC, 30 seconds each. Mod cues for continued participation. Sitting balance training in [facility] for reaching with R UE to grab of move cup to from L and R and maintain sitting without back support. Reaching with L UE x 2 minutes with max Assist patient able to initiate trace shoulder flexion,  PT instructed patient in SB transfer x 2 attempts with Max Assist with max cues for posture, increased use of BLE andR LE postioning. Sitting balance on mat table with min-Max assist due to fatigue with intermintent max tactile facilitation for trunk elongation on the R side. Patient returned to room and returned to bed with total Assist +2. Patient left with all needs met and family present.   Therapy Documentation Precautions:  Precautions Precautions: Fall Precaution Comments: PEG and trach Restrictions Weight Bearing Restrictions: No General:   Missed time: 15 min ( patient fatigue) Vital Signs: Therapy Vitals Pulse Rate: 83 Resp: 18 BP: 101/67 Patient Position (if appropriate): Lying Oxygen Therapy SpO2: 98 % O2 Device: Not Delivered   See Function Navigator for Current Functional Status.   Therapy/Group: Individual Therapy  Lorie Phenix 05/30/2016, 7:56 AM

## 2016-05-30 NOTE — Progress Notes (Signed)
[facility] PHYSICAL MEDICINE & REHABILITATION     PROGRESS NOTE    Subjective/Complaints: Continues to have knee pain. Reviewed MRI from 10 5. No fevers or chills, has had good urinary output  ROS limited by cognition  Objective: Vital Signs: Blood pressure 101/67, pulse 85, temperature 98.6 F (37 C), temperature source Oral, resp. rate 18, height 5\' 7"  (1.702 m), weight 72.1 kg (158 lb 15.2 oz), SpO2 98 %. No results found. No results for input(s): WBC, HGB, HCT, PLT in the last 72 hours.  Recent Labs  05/29/16 0804  NA 143  K 4.0  CL 103  GLUCOSE 84  BUN 55*  CREATININE 1.34*  CALCIUM 10.4*   CBG (last 3)  No results for input(s): GLUCAP in the last 72 hours.  Wt Readings from Last 3 Encounters:  05/30/16 72.1 kg (158 lb 15.2 oz)  05/25/16 78.2 kg (172 lb 8 oz)    Physical Exam:  HENT:  Head: Normocephalic.  Eyes:  Pupils sluggish to light Neck:  Tracheostomy tube in place, #4 Cardiovascular: Normal rateand regular rhythm.  Respiratory: Effort normaland breath sounds normal. Good air movement. No wheezes or rales.  GI: Soft. Bowel sounds are normal.  PEG tube intact Neurological: dysconjugate gaze. Tracks with right eye. Fol]lows simple one step commands. Understands no English Patient appears a bit restless. Exam limited by language barrier. left hemiplegia--0/5 LUE. Perhaps 1/5 Left HF. 1/4 resting tone LUE. Left hamstrings/gastroc 2/4 with 5-6 beats of clonus. Engages with right hand.  Musculoskeletal: No evidence of effusion in bilateral knees. No pain with range of motion in the right knee, minimal discomfort with end range extension of the left knee. Patient has increased hamstring tone in the left lower extremity. No tenderness over the patella or the patellar tendon. Mild medial joint line tenderness on the left side only. No evidence of erythema Assessment/Plan: 1. Functional deficits secondary to TBI which require 3+ hours per day of  interdisciplinary therapy in a comprehensive inpatient rehab setting. Physiatrist is providing close team supervision and 24 hour management of active medical problems listed below. Physiatrist and rehab team continue to assess barriers to discharge/monitor patient progress toward functional and medical goals.  Function:  Bathing Bathing position   Position: Shower (via roll in shower chair)  Bathing parts Body parts bathed by patient: Chest, Abdomen, Left arm, Right upper leg, Front perineal area, Left upper leg Body parts bathed by helper: Right arm, Buttocks, Left lower leg, Back, Right lower leg  Bathing assist Assist Level: 2 helpers      Upper Body Dressing/Undressing Upper body dressing   What is the patient wearing?: Pull over shirt/dress     Pull over shirt/dress - Perfomed by patient: Put head through opening, Thread/unthread right sleeve Pull over shirt/dress - Perfomed by helper: Thread/unthread left sleeve, Pull shirt over trunk        Upper body assist        Lower Body Dressing/Undressing Lower body dressing   What is the patient wearing?: Pants, Ted Hose, Shoes     Pants- Performed by patient: Thread/unthread right pants leg Pants- Performed by helper: Thread/unthread left pants leg, Pull pants up/down   Non-skid slipper socks- Performed by helper: Don/doff right sock, Don/doff left sock       Shoes - Performed by helper: Don/doff right shoe, Don/doff left shoe, Fasten right, Fasten left       TED Hose - Performed by helper: Don/doff right TED hose, Don/doff left TED hose  Lower body assist Assist for lower body dressing: 2 Helpers      Toileting Toileting     Toileting steps completed by helper: Adjust clothing prior to toileting, Performs perineal hygiene, Adjust clothing after toileting    Toileting assist Assist level: Two helpers   Transfers Chair/bed transfer   Chair/bed transfer method: Squat pivot Chair/bed transfer assist level: 2  helpers Chair/bed transfer assistive device: Mechanical lift Mechanical lift: Air traffic controller Ambulation activity did not occur: Safety/medical concerns   Max distance: 8 Assist level: 2 helpers   Wheelchair   Type: Manual (TIS )   Assist Level: Dependent (Pt equals 0%)  Cognition Comprehension Comprehension assist level: Understands basic 75 - 89% of the time/ requires cueing 10 - 24% of the time  Expression Expression assist level: Expresses basic 25 - 49% of the time/requires cueing 50 - 75% of the time. Uses single words/gestures.  Social Interaction Social Interaction assist level: Interacts appropriately 50 - 74% of the time - May be physically or verbally inappropriate.  Problem Solving Problem solving assist level: Solves basic 25 - 49% of the time - needs direction more than half the time to initiate, plan or complete simple activities  Memory Memory assist level: Recognizes or recalls 25 - 49% of the time/requires cueing 50 - 75% of the time   Medical Problem List and Plan: 1.  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  DVT Prophylaxis/Anticoagulation: Subcutaneous Lovenox.  -vascular studies are normal 3. Pain Management: Tylenol as needed  -add low dose gabapentin for neuropathic pain 4. Tracheostomy.PMV. Follow speech therapy  -#4 trach capped 5. Neuropsych: This patient is not capable of making decisions on his own behalf. 6. Skin/Wound Care: Routine skin checks 7. Fluids/Electrolytes/Nutrition: all labs personally reviewed  -increase water through tube to  8. PEG tube.NPO. Progress per SLP 9. ESBL Klebsiella in sputum culture. Contact precautions 10.  HCAP. Antibiotic therapy completed monitor for any fever 11. Gout. 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 Left knee pain, this  is likely spasticity related and may have some paresthesias as well. See #12, may need to titrate gabapentin as well LOS (Days) 5 A FACE TO FACE EVALUATION WAS PERFORMED  Erick Colace 05/30/2016 9:27 AM

## 2016-05-30 NOTE — Progress Notes (Signed)
Speech Language Pathology Daily Session Note  Patient Details  Name: [name] [name] MRN: [id number] Date of Birth: 08/20/1990  Today's Date: 05/30/2016 SLP Individual Time: 0930-1015 SLP Individual Time Calculation (min): 45 min   Short Term Goals: Week 1: SLP Short Term Goal 1 (Week 1): Patient will consume trials of Dys. 1 textures without overt s/s of aspiration with Mod A verbal cues for use of swallowing compensatory strateiges over 3 sessions prior to repeat MBS.  SLP Short Term Goal 2 (Week 1): Patient will verbally answer questions in 75% of opportunties with Min A verbal cues.  SLP Short Term Goal 3 (Week 1): Patient will utilize speech intelligibility strategies at the phrase level with Mod A verbal cues to achieve 90% intelligibility. SLP Short Term Goal 4 (Week 1): Patient will wear PMSV with full supervision with all vitals remaining WFL.  SLP Short Term Goal 5 (Week 1): Patient will demonstrate sustained attention to tasks for 15 minutes with Mod A verbal cues for redirection.  SLP Short Term Goal 6 (Week 1): Patient will initiate functional tasks with Mod A verbal cues.   Skilled Therapeutic Interventions:Skilled therapy intervention focused on speech and dysphagia goals. Patient recalled the names of the interpreter and therapist after review and 10 minute delay. Patient required mod A of verbal/visual cueing and model to demonstrate adequate voicing throughout session. Able to generate speech at the sentence level with mod A and reasonable intelligibility within context.  Patient displayed no overt s/s of aspiration during trials of ice chips and pudding. Intermittent (min) verbal cueing to elicit double swallows with each bolus. Pt able to self-feed with therapist assist for getting an appropriately-sized bolus on the spoon.    Function:  Eating Eating                 Cognition Comprehension Comprehension assist level: Understands basic 50 - 74% of the time/ requires  cueing 25 - 49% of the time  Expression   Expression assist level: Expresses basic 25 - 49% of the time/requires cueing 50 - 75% of the time. Uses single words/gestures.  Social Interaction Social Interaction assist level: Interacts appropriately 75 - 89% of the time - Needs redirection for appropriate language or to initiate interaction.  Problem Solving Problem solving assist level: Solves basic 25 - 49% of the time - needs direction more than half the time to initiate, plan or complete simple activities  Memory Memory assist level: Recognizes or recalls 25 - 49% of the time/requires cueing 50 - 75% of the time    Pain Pain Assessment Pain Assessment: No/denies pain  Therapy/Group: Individual Therapy  Rocky CraftsKara E Andraya Frigon MA, CCC-SLP 05/30/2016, 12:37 PM

## 2016-05-30 NOTE — Progress Notes (Signed)
Physical Therapy Session Note  Patient Details  Name: [place] CraneOmar Firebaugh MRN: [id number] Date of Birth: 03/11/1991  Today's Date: 05/30/2016 PT Individual Time: 1345-1415 PT Individual Time Calculation (min): 30 min    Short Term Goals:Week 1:  PT Short Term Goal 1 (Week 1): Pt will consistently perform functional transfers with max A PT Short Term Goal 2 (Week 1): Pt perform sitting balance for functional task with min A  Skilled Therapeutic Interventions/Progress Updates:    Session focused on neuro re-ed to address postural control, forced use of LLE, balance, sit <> stands, and functional mobility (bed and transfers). Pt requires cues for sequencing, technique, attention, and reorientation to midline. Pt requires +2 assist for all activities with facilitation for weighshifting, upright posture, and pelvic dissociation during transitional movements. Completed 5 x sit <> stand with rail in front of patient with standing tolerance 2-8 seconds and max verbal and tactile cues to maintain erect posture.   Family present to interpret for patient/therapist during session.   Therapy Documentation Precautions:  Precautions Precautions: Fall Precaution Comments: PEG and trach Restrictions Weight Bearing Restrictions: No General:   Vital Signs: Therapy Vitals Pulse Rate: 67 Resp: 15 BP: 107/79 Patient Position (if appropriate): Lying Oxygen Therapy SpO2: 100 % O2 Device: Not Delivered Pain: Pain Assessment Pain Assessment: No/denies pain Mobility:   Locomotion :    Trunk/Postural Assessment :    Balance:   Exercises:   Other Treatments:     See Function Navigator for Current Functional Status.   Therapy/Group: Individual Therapy  Karolee StampsGray, Yola Paradiso [facility] 05/30/2016, 3:14 PM

## 2016-05-30 NOTE — Progress Notes (Signed)
Occupational Therapy Session Note  Patient Details  Name: [name] [name] MRN: [id number] Date of Birth: 09/30/1990  Today's Date: 05/30/2016 OT Individual Time: [id number] OT Individual Time Calculation (min): 60 min     Short Term Goals: Week 1:  OT Short Term Goal 1 (Week 1): Pt will roll to L with mod A of 1 to assist caregivers with LB dressing.  OT Short Term Goal 2 (Week 1): Pt will roll to R with max A of 1 to assist caregivers with LB dressing. OT Short Term Goal 3 (Week 1): Pt will tolerate sitting EOB for 15 min with min A to engage in grooming activities. OT Short Term Goal 4 (Week 1): Pt will demonstrate improved motor planning with bathing with mod A bed level. OT Short Term Goal 5 (Week 1): Pt will complete slide board transfer with max A of 2 helpers.  Skilled Therapeutic Interventions/Progress Updates:    Treatment session with focus on functional transfers, cognition, and sequencing with ADL retraining tasks.  Pt in bed upon arrival, requiring increased cues to arouse.  Completed bed mobility with mod assist to come up to sitting on EOB and then +2 squat pivot transfer bed > roll in shower chair.  Trach cover and IV cover in place.  Completed bathing in room shower with cues for pt to initiate washing body and to utilize wash cloth for bathing, as pt would pass the wash cloth back to therapist this session.  Required +2 for sit > stand to wash buttocks.  Dressing tasks completed with LB dressing at sit > stand with pt able to stand x4 for 30 seconds to 1 minute at a time while drying buttocks and donning brief and pants.  Pt with increased initiation during 2nd sit > stand and then required increased assist as he fatigued.  Total assist with UB and LB dressing this session due to fatigue.  Left semi-reclined in tilt in space w/c with SLP arriving.  Therapy Documentation Precautions:  Precautions Precautions: Fall Precaution Comments: PEG and trach Restrictions Weight Bearing  Restrictions: No General:   Vital Signs: Therapy Vitals Pulse Rate: 85 Resp: 18 Patient Position (if appropriate): Lying Oxygen Therapy SpO2: 98 % O2 Device: Not Delivered Pain:   Pt with intermittent c/o pain in back, RN notified  See Function Navigator for Current Functional Status.   Therapy/Group: Individual Therapy  Rosalio LoudHOXIE, Delara Shepheard 05/30/2016, 10:54 AM

## 2016-05-31 ENCOUNTER — Inpatient Hospital Stay (HOSPITAL_COMMUNITY): Payer: Medicaid Other | Admitting: Physical Therapy

## 2016-05-31 MED ORDER — GABAPENTIN 100 MG PO CAPS
200.0000 mg | ORAL_CAPSULE | Freq: Three times a day (TID) | ORAL | Status: DC
  Administered 2016-05-31 – 2016-06-04 (×14): 200 mg via ORAL
  Filled 2016-05-31 (×16): qty 2

## 2016-05-31 NOTE — Progress Notes (Signed)
Physical Therapy Session Note  Patient Details  Name: [place] CraneOmar Shedrick MRN: [id number] Date of Birth: 09/03/1990  Today's Date: 05/31/2016 PT Individual Time: 0800-0900 PT Individual Time Calculation (min): 60 min    Short Term Goals: Week 1:  PT Short Term Goal 1 (Week 1): Pt will consistently perform functional transfers with max A PT Short Term Goal 2 (Week 1): Pt perform sitting balance for functional task with min A  Skilled Therapeutic Interventions/Progress Updates:    Pt was seen bedside in the am. Pt's family at bedside to assist with interpreting. Pt rolled with mod A and verbal cues. Pt transferred supine to edge of bed with total A. Pt transferred edge of bed to w/c with total A stand pivot. Pt transported to gym. Initially treatment focused on standing in parallel bars. Pt stood x 3 with max to total A and verbal cues. While standing worked on upright posture and weight shifting. Pt transferred w/c to edge of bed, edge of mat to w/c with squat pivot and max to total A with verbal cues. Pt performed sliding board transfers w/c to edge of mat, edge of mat to w/c with max to total A and verbal cues. While on mat pt maintained sitting balance about 5 minutes with c/s to min A and verbal cues. Pt performed sit to stand with stedy x 3, Pt's initial stand with stedy required max A x 1 however as pt fatigued required increased level of assistance and by last stand required max A + 2. Pt transferred from edge of mat to w/c with stedy and +2 assist secondary to fatigue. Pt returned to room and left sitting up in w/c with quick release belt in place and call bell within reach.  Therapy Documentation Precautions:  Precautions Precautions: Fall Precaution Comments: PEG and trach Restrictions Weight Bearing Restrictions: No General:   Pain: No c/o pain.   See Function Navigator for Current Functional Status.   Therapy/Group: Individual Therapy  Rayford HalstedMitchell, Duanne Duchesne G 05/31/2016, 12:24 PM

## 2016-05-31 NOTE — Plan of Care (Signed)
Problem: RH BLADDER ELIMINATION Goal: RH STG MANAGE BLADDER WITH EQUIPMENT WITH ASSISTANCE STG Manage Bladder With Equipment With total Assistance   Outcome: Not Progressing Pt has catheter

## 2016-05-31 NOTE — Progress Notes (Signed)
[facility] PHYSICAL MEDICINE & REHABILITATION     PROGRESS NOTE    Subjective/Complaints: Today complaining of left knee pain. According to his brother. His right knee is no longer hurting him. Patient does not follow commands consistently, answers yes no questions. Brother speaks English quite well  ROS limited by cognition  Objective: Vital Signs: Blood pressure 104/68, pulse 65, temperature 98.1 F (36.7 C), temperature source Oral, resp. rate 16, height 5\' 7"  (1.702 m), weight 74.4 kg (164 lb 1.6 oz), SpO2 100 %. No results found. No results for input(s): WBC, HGB, HCT, PLT in the last 72 hours.  Recent Labs  05/29/16 0804  NA 143  K 4.0  CL 103  GLUCOSE 84  BUN 55*  CREATININE 1.34*  CALCIUM 10.4*   CBG (last 3)  No results for input(s): GLUCAP in the last 72 hours.  Wt Readings from Last 3 Encounters:  05/31/16 74.4 kg (164 lb 1.6 oz)  05/25/16 78.2 kg (172 lb 8 oz)    Physical Exam:  HENT:  Head: Normocephalic.  Eyes:  Pupils sluggish to light Neck:  Tracheostomy tube in place, #4 Cardiovascular: Normal rateand regular rhythm.  Respiratory: Effort normaland breath sounds normal. Good air movement. No wheezes or rales.  GI: Soft. Bowel sounds are normal.  PEG tube intact Neurological: dysconjugate gaze. Tracks with right eye. Fol]lows simple one step commands. Understands no [place] . left hemiplegia--0/5 LUE. Perhaps 1/5 Left HF. 1/4 resting tone LUE. Left hamstrings/gastroc 2/4 with 5-6 beats of clonus. Engages with right hand.  Musculoskeletal: No evidence of effusion in bilateral knees. No pain with range of motion in the right knee, minimal discomfort with end range extension of the left knee. Patient has increased hamstring tone in the left lower extremity. No tenderness over the patella or the patellar tendon. Mild medial joint line tenderness on the left side only. No evidence of erythema Assessment/Plan: 1. Functional deficits secondary to TBI  which require 3+ hours per day of interdisciplinary therapy in a comprehensive inpatient rehab setting. Physiatrist is providing close team supervision and 24 hour management of active medical problems listed below. Physiatrist and rehab team continue to assess barriers to discharge/monitor patient progress toward functional and medical goals.  Function:  Bathing Bathing position   Position: Shower (roll in shower chair)  Bathing parts Body parts bathed by patient: Left arm, Chest, Abdomen, Front perineal area, Right upper leg, Left upper leg Body parts bathed by helper: Right arm, Buttocks, Right lower leg, Left lower leg, Back  Bathing assist Assist Level: 2 helpers      Upper Body Dressing/Undressing Upper body dressing   What is the patient wearing?: Pull over shirt/dress     Pull over shirt/dress - Perfomed by patient: Put head through opening, Thread/unthread right sleeve Pull over shirt/dress - Perfomed by helper: Thread/unthread right sleeve, Thread/unthread left sleeve, Put head through opening, Pull shirt over trunk        Upper body assist Assist Level:  (total assist)      Lower Body Dressing/Undressing Lower body dressing   What is the patient wearing?: Pants, Non-skid slipper socks     Pants- Performed by patient: Thread/unthread right pants leg Pants- Performed by helper: Thread/unthread right pants leg, Thread/unthread left pants leg, Pull pants up/down   Non-skid slipper socks- Performed by helper: Don/doff right sock, Don/doff left sock       Shoes - Performed by helper: Don/doff right shoe, Don/doff left shoe, Fasten right, Fasten left  TED Hose - Performed by helper: Don/doff right TED hose, Don/doff left TED hose  Lower body assist Assist for lower body dressing: 2 Helpers      Toileting Toileting     Toileting steps completed by helper: Adjust clothing prior to toileting, Performs perineal hygiene, Adjust clothing after toileting     Toileting assist Assist level: Two helpers   Transfers Chair/bed transfer   Chair/bed transfer method: Stand pivot Chair/bed transfer assist level: 2 helpers Chair/bed transfer assistive device: Mechanical lift Mechanical lift: Air traffic controller Ambulation activity did not occur: Safety/medical concerns   Max distance: 8 Assist level: 2 helpers   Wheelchair   Type: Manual (TIS )   Assist Level: Dependent (Pt equals 0%)  Cognition Comprehension Comprehension assist level: Understands basic 50 - 74% of the time/ requires cueing 25 - 49% of the time  Expression Expression assist level: Expresses basic 25 - 49% of the time/requires cueing 50 - 75% of the time. Uses single words/gestures.  Social Interaction Social Interaction assist level: Interacts appropriately 75 - 89% of the time - Needs redirection for appropriate language or to initiate interaction.  Problem Solving Problem solving assist level: Solves basic 25 - 49% of the time - needs direction more than half the time to initiate, plan or complete simple activities  Memory Memory assist level: Recognizes or recalls 25 - 49% of the time/requires cueing 50 - 75% of the time   Medical Problem List and Plan: 1.  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  DVT Prophylaxis/Anticoagulation: Subcutaneous Lovenox.  -vascular studies are normal 3. Pain Management: Tylenol as needed  -add low dose gabapentin for neuropathic pain 4. Tracheostomy.PMV. Follow speech therapy  -#4 trach capped 5. Neuropsych: This patient is not capable of making decisions on his own behalf. 6. Skin/Wound Care: Routine skin checks 7. Fluids/Electrolytes/Nutrition: all labs personally reviewed  -increase water through tube to  8. PEG tube.NPO. Progress per SLP 9. ESBL Klebsiella in sputum culture. Contact precautions 10.   HCAP. Antibiotic therapy completed monitor for any fever, Currently afebrile 11. Gout. 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 Left knee pain, this is likely spasticity related and may have some paresthesias as well. See #12, Will titrate gabapentin as well LOS (Days) 6 A FACE TO FACE EVALUATION WAS PERFORMED  KIRSTEINS,ANDREW E 05/31/2016 10:59 AM

## 2016-06-01 ENCOUNTER — Inpatient Hospital Stay (HOSPITAL_COMMUNITY): Payer: Medicaid Other

## 2016-06-01 ENCOUNTER — Inpatient Hospital Stay (HOSPITAL_COMMUNITY): Payer: Medicaid Other | Admitting: Speech Pathology

## 2016-06-01 ENCOUNTER — Inpatient Hospital Stay (HOSPITAL_COMMUNITY): Payer: Medicaid Other | Admitting: Occupational Therapy

## 2016-06-01 LAB — BASIC METABOLIC PANEL
ANION GAP: 11 (ref 5–15)
BUN: 53 mg/dL — AB (ref 6–20)
CHLORIDE: 99 mmol/L — AB (ref 101–111)
CO2: 29 mmol/L (ref 22–32)
Calcium: 9.5 mg/dL (ref 8.9–10.3)
Creatinine, Ser: 1.19 mg/dL (ref 0.61–1.24)
Glucose, Bld: 123 mg/dL — ABNORMAL HIGH (ref 65–99)
POTASSIUM: 3.9 mmol/L (ref 3.5–5.1)
SODIUM: 139 mmol/L (ref 135–145)

## 2016-06-01 LAB — GLUCOSE, CAPILLARY: GLUCOSE-CAPILLARY: 82 mg/dL (ref 65–99)

## 2016-06-01 MED ORDER — CAMPHOR-MENTHOL 0.5-0.5 % EX LOTN
TOPICAL_LOTION | Freq: Three times a day (TID) | CUTANEOUS | Status: DC
  Administered 2016-06-01 – 2016-06-15 (×34): via TOPICAL
  Administered 2016-06-16: 1 via TOPICAL
  Administered 2016-06-16 – 2016-06-17 (×4): via TOPICAL
  Administered 2016-06-17: 1 via TOPICAL
  Administered 2016-06-18 – 2016-06-19 (×4): via TOPICAL
  Filled 2016-06-01 (×2): qty 222

## 2016-06-01 MED ORDER — JEVITY 1.2 CAL PO LIQD
ORAL | Status: AC
  Administered 2016-06-01: 350 mL
  Filled 2016-06-01: qty 237

## 2016-06-01 NOTE — Progress Notes (Signed)
Speech Language Pathology Daily Session Note  Patient Details  Name: [name] [name] MRN: [id number] Date of Birth: 07/23/1991  Today's Date: 06/01/2016 SLP Individual Time: 1100-1200 SLP Individual Time Calculation (min): 60 min   Short Term Goals: Week 1: SLP Short Term Goal 1 (Week 1): Patient will consume trials of Dys. 1 textures without overt s/s of aspiration with Mod A verbal cues for use of swallowing compensatory strateiges over 3 sessions prior to repeat MBS.  SLP Short Term Goal 2 (Week 1): Patient will verbally answer questions in 75% of opportunties with Min A verbal cues.  SLP Short Term Goal 3 (Week 1): Patient will utilize speech intelligibility strategies at the phrase level with Mod A verbal cues to achieve 90% intelligibility. SLP Short Term Goal 4 (Week 1): Patient will wear PMSV with full supervision with all vitals remaining WFL.  SLP Short Term Goal 5 (Week 1): Patient will demonstrate sustained attention to tasks for 15 minutes with Mod A verbal cues for redirection.  SLP Short Term Goal 6 (Week 1): Patient will initiate functional tasks with Mod A verbal cues.   Skilled Therapeutic Interventions:Skilled therapy intervention focused on dysphagia and cognitive goals. Patient required Mod A for left inattention and procedural memory during oral care with suction toothbrush. Patient given Max A verbal cues for short term memory and selective attention to utilize swallow strategies in a moderately distracting environment. Patient displayed s/s of aspiration when he coughed one time while consuming trial of Dys.1 texture, likely due to oral and vallecular residue. One time during trials of ice chips patient demonstrated wet vocal quality and was given verbal instructions to clear liquid. Given Mod A verbal uses for initiation, patient vocalized at the phrase level while engaging in functional tasks and social communication. Vitals remained [facility] for duration of session. Patient left  upright in wheelchair with family in room. Continue current plan of care.    Function:  Eating Eating   Modified Consistency Diet: Yes Eating Assist Level: Set up assist for;Supervision or verbal cues;Helper checks for pocketed food;Helper scoops food on utensil   Eating Set Up Assist For: Opening containers Helper Scoops Food on Utensil: Occasionally     Cognition Comprehension Comprehension assist level: Understands basic 50 - 74% of the time/ requires cueing 25 - 49% of the time  Expression Expression assistive device:  (interpreter) Expression assist level: Expresses basic 25 - 49% of the time/requires cueing 50 - 75% of the time. Uses single words/gestures.  Social Interaction Social Interaction assist level: Interacts appropriately 25 - 49% of time - Needs frequent redirection.  Problem Solving Problem solving assist level: Solves basic 50 - 74% of the time/requires cueing 25 - 49% of the time  Memory Memory assist level: Recognizes or recalls 25 - 49% of the time/requires cueing 50 - 75% of the time    Pain Pain Assessment Pain Assessment: No/denies pain  Therapy/Group: Individual Therapy  [name] [name] 06/01/2016, 4:04 PM

## 2016-06-01 NOTE — Progress Notes (Signed)
Orthopedic Tech Progress Note Patient Details:  [name] [name] 07/29/1991 [id number]  Patient ID: [name] [name], male   DOB: 12/30/1990, 25 y.o.   MRN: [id number]   Saul FordyceJennifer C Andreika Vandagriff 06/01/2016, 3:38 PMCalled [employer] for left Prafo.

## 2016-06-01 NOTE — Progress Notes (Signed)
Physical Therapy Session Note  Patient Details  Name: [name] [name] MRN: [id number] Date of Birth: 01/05/1991  Today's Date: 06/01/2016 PT Individual Time: 1302-1426 PT Individual Time Calculation (min): 84 min    Short Term Goals: Week 1:  PT Short Term Goal 1 (Week 1): Pt will consistently perform functional transfers with max A PT Short Term Goal 2 (Week 1): Pt perform sitting balance for functional task with min A  Skilled Therapeutic Interventions/Progress Updates:    Session focused on neuro re-ed, attention, initiation, overall activity tolerance, and functional mobility and balance during bed mobility, transfers, sit <> stands, and standing using standing frame. Neuro re-ed activities to address postural control, forced use of LLE, weightbearing through LUE, reorientation to midline, and motor control. Pt requires +2 for sit <> stands without assistive device and difficulty maintaining or getting into full upright posture - this was improved with use of standing frame and pt able to participate in horsehoe reach and toss with intervals of 2-3 min x 4 trials in standing. Bed mobility on flat mat including rolling, bridging, and supine <> sit re-training with max verbal cues for initiation and sequencing of tasks. Educated family and interpreter on decreasing their cues to allow patient time for processing. Supine LLE stretching and AAROM by PT during therapeutic rest break. Asked for order for L PRAFO to assist with positioning in neutral and decreasing foot drop risk - pt very tight in heel cords - family verbalized understanding of this recommendation. Completed stand pivot back to bed at end of session with total +2 assist - reviewed use of bridging technique practiced in gym for improved bed mobility. Left with RN in room and all needs in reach. Pt missed 6 min due to fatigue.  Medical interpreter present during session.    Therapy Documentation Precautions:  Precautions Precautions:  Fall Precaution Comments: PEG and trach Restrictions Weight Bearing Restrictions: No   Pain: C/o pain in L knee - RN notified for pain medication.    See Function Navigator for Current Functional Status.   Therapy/Group: Individual Therapy  [name] [name], [name] [name]  Pura Picinich B. [name], PT, DPT  06/01/2016, 2:33 PM

## 2016-06-01 NOTE — Progress Notes (Signed)
Occupational Therapy Session Note  Patient Details  Name: [name] [name] MRN: [id number] Date of Birth: 12/27/1990  Today's Date: 06/01/2016 OT Individual Time: 1000-1105 OT Individual Time Calculation (min): 65 min     Short Term Goals:Week 1:  OT Short Term Goal 1 (Week 1): Pt will roll to L with mod A of 1 to assist caregivers with LB dressing.  OT Short Term Goal 2 (Week 1): Pt will roll to R with max A of 1 to assist caregivers with LB dressing. OT Short Term Goal 3 (Week 1): Pt will tolerate sitting EOB for 15 min with min A to engage in grooming activities. OT Short Term Goal 4 (Week 1): Pt will demonstrate improved motor planning with bathing with mod A bed level. OT Short Term Goal 5 (Week 1): Pt will complete slide board transfer with max A of 2 helpers.  Skilled Therapeutic Interventions/Progress Updates:    OT tx focus on: Caregiver training with pt's sister, [name] [name], with whom he will be living with after discharge.  [name] [name] actively involved this session as the +2 helper. Cognition- pt staying focused on task with max cues at times to complete task, he will often stop and point to therapist or family to complete the task Vision - maintaining visual focus on L hand during bathing Bed mobility - pt actively bending knees with max cues, mod A to roll to side with mod A. Pt pushing up to sit with mod A using R hand and max cues for full effort Squat pivot to w/c to L side with max A of 2 with pt using R hand to push body wt forward Bathing/dressing at sink with focus on keeping head lifted, focusing on completing task. Stood to sink with max A of 2, but maintained head position down and could not achieve a full stand. Toileting- pt did request to use bathroom Pt used bar with R hand for semi squat/stand to transfer to R with max facilitation for forward lean. Pt had loose bowel in briefs.  Changed briefs and sister assisted with cleansing pt and clothing management as therapist held pt in  forward wt shift. Transferred back to w/c.  Speech therapy arrived for his next session.  Reviewed with sister the need to have bathroom door measurements and photos of bathrooms to determine if they will be w/c accessible.  Therapy Documentation Precautions:  Precautions Precautions: Fall Precaution Comments: PEG and trach Restrictions Weight Bearing Restrictions: No   Pain: Pain Assessment Pain Assessment: No/denies pain ADL: ADL ADL Comments: refer to functional navigator  See Function Navigator for Current Functional Status.   Therapy/Group: Individual Therapy  [name] [name] 06/01/2016, 12:45 PM

## 2016-06-01 NOTE — Progress Notes (Signed)
[facility] PHYSICAL MEDICINE & REHABILITATION     PROGRESS NOTE    Subjective/Complaints: Today complaining of left knee pain. According to his brother. His right knee is no longer hurting him. Patient does not follow commands consistently, answers yes no questions. Brother speaks English quite well  ROS limited by cognition  Objective: Vital Signs: Blood pressure 118/72, pulse 80, temperature 98.3 F (36.8 C), temperature source Oral, resp. rate 15, height 5\' 7"  (1.702 m), weight 77.6 kg (171 lb), SpO2 100 %. No results found. No results for input(s): WBC, HGB, HCT, PLT in the last 72 hours. No results for input(s): NA, K, CL, GLUCOSE, BUN, CREATININE, CALCIUM in the last 72 hours.  Invalid input(s): CO CBG (last 3)   Recent Labs  05/31/16 0813  GLUCAP 82    Wt Readings from Last 3 Encounters:  06/01/16 77.6 kg (171 lb)  05/25/16 78.2 kg (172 lb 8 oz)    Physical Exam:  HENT:  Head: Normocephalic.  Eyes:  Pupils sluggish to light Neck:  Tracheostomy tube in place, #4 Cardiovascular: Normal rateand regular rhythm.  Respiratory: Effort normaland breath sounds normal. Good air movement. No wheezes or rales.  GI: Soft. Bowel sounds are normal.  PEG tube intact Neurological: dysconjugate gaze. Tracks with right eye. Fol]lows simple one step commands. Understands no [place] . left hemiplegia--0/5 LUE. Perhaps 1/5 Left HF. 1/4 resting tone LUE. Left hamstrings/gastroc 2/4 with 5-6 beats of clonus. Engages with right hand.  Musculoskeletal: No evidence of effusion in bilateral knees. No pain with range of motion in the right knee, minimal discomfort with end range extension of the left knee. Patient has increased hamstring tone in the left lower extremity. No tenderness over the patella or the patellar tendon. Mild medial joint line tenderness on the left side only. No evidence of erythema Assessment/Plan: 1. Functional deficits secondary to TBI which require 3+ hours  per day of interdisciplinary therapy in a comprehensive inpatient rehab setting. Physiatrist is providing close team supervision and 24 hour management of active medical problems listed below. Physiatrist and rehab team continue to assess barriers to discharge/monitor patient progress toward functional and medical goals.  Function:  Bathing Bathing position   Position: Wheelchair/chair at sink  Bathing parts Body parts bathed by patient: Left arm, Chest, Front perineal area, Right upper leg, Left upper leg, Abdomen Body parts bathed by helper: Right arm, Buttocks, Right lower leg, Left lower leg, Back  Bathing assist Assist Level: 2 helpers      Upper Body Dressing/Undressing Upper body dressing   What is the patient wearing?: Pull over shirt/dress     Pull over shirt/dress - Perfomed by patient: Thread/unthread left sleeve Pull over shirt/dress - Perfomed by helper: Thread/unthread left sleeve, Put head through opening, Pull shirt over trunk        Upper body assist Assist Level:  (total assist)      Lower Body Dressing/Undressing Lower body dressing   What is the patient wearing?: Pants, Ted Hose, Shoes     Pants- Performed by patient: Thread/unthread right pants leg Pants- Performed by helper: Thread/unthread left pants leg, Pull pants up/down   Non-skid slipper socks- Performed by helper: Don/doff right sock, Don/doff left sock       Shoes - Performed by helper: Don/doff right shoe, Don/doff left shoe, Fasten right, Fasten left       TED Hose - Performed by helper: Don/doff right TED hose, Don/doff left TED hose  Lower body assist Assist for lower  body dressing: 2 Audiological scientist steps completed by helper: Adjust clothing prior to toileting, Performs perineal hygiene, Adjust clothing after toileting    Toileting assist Assist level: Two helpers   Transfers Chair/bed transfer   Chair/bed transfer method: Squat pivot Chair/bed  transfer assist level: Total assist (Pt < 25%) Chair/bed transfer assistive device: Mechanical lift Mechanical lift: [place]   Locomotion Ambulation Ambulation activity did not occur: Safety/medical concerns   Max distance: 8 Assist level: 2 helpers   Wheelchair   Type: Manual (TIS )   Assist Level: Dependent (Pt equals 0%)  Cognition Comprehension Comprehension assist level: Understands basic 50 - 74% of the time/ requires cueing 25 - 49% of the time  Expression Expression assist level: Expresses basic 25 - 49% of the time/requires cueing 50 - 75% of the time. Uses single words/gestures.  Social Interaction Social Interaction assist level: Interacts appropriately 50 - 74% of the time - May be physically or verbally inappropriate.  Problem Solving Problem solving assist level: Solves basic 25 - 49% of the time - needs direction more than half the time to initiate, plan or complete simple activities  Memory Memory assist level: Recognizes or recalls 25 - 49% of the time/requires cueing 50 - 75% of the time   Medical Problem List and Plan: 1.  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  DVT Prophylaxis/Anticoagulation: Subcutaneous Lovenox.  -vascular studies are normal 3. Pain Management: Tylenol as needed  - gabapentin 200mg  TID for neuropathic pain  -left knee pain ?neuropathic 4. Tracheostomy.PMV. Follow speech therapy  -#4 trach capped  -if no issues today, will remove in morning 5. Neuropsych: This patient is not capable of making decisions on his own behalf. 6. Skin/Wound Care: Routine skin checks 7. Fluids/Electrolytes/Nutrition: all labs personally reviewed  -increase water through tube to  8. PEG tube.NPO. Progress per SLP 9. ESBL Klebsiella in sputum culture. Contact precautions 10.  HCAP. Antibiotic therapy completed monitor for any fever, Currently  afebrile 11. Gout. Colchicine 12. Spastic left hemiparesis---baclofen Increased to 10 mg 3 times a day    LOS (Days) 7 A FACE TO FACE EVALUATION WAS PERFORMED  SWARTZ,ZACHARY T 06/01/2016 1:40 PM

## 2016-06-02 ENCOUNTER — Inpatient Hospital Stay (HOSPITAL_COMMUNITY): Payer: Medicaid Other | Admitting: Speech Pathology

## 2016-06-02 ENCOUNTER — Inpatient Hospital Stay (HOSPITAL_COMMUNITY): Payer: Self-pay | Admitting: Physical Therapy

## 2016-06-02 ENCOUNTER — Inpatient Hospital Stay (HOSPITAL_COMMUNITY): Payer: Medicaid Other | Admitting: Occupational Therapy

## 2016-06-02 NOTE — Progress Notes (Signed)
Physical Therapy Weekly Progress Note  Patient Details  Name: [name] [name] MRN: [id number] Date of Birth: [birth year]  Beginning of progress report period: May 26, 2016 End of progress report period: June 02, 2016  Patient has met 1 of 2 short term goals.  Pt has improved sitting balance and is able to sit with close supervision for static balance, min A for dynamic sitting balance. Pt still with difficulty maintaining upright posture and requires +2 assist for functional transfers.  Pt limited by decreased attention and impaired endurance and therefore will continue to benefit from skilled PT intervention to enhance overall performance with activity tolerance, balance, postural control, ability to compensate for deficits, functional use of  left upper extremity and left lower extremity, attention, awareness and coordination.  Patient progressing toward long term goals..  Continue plan of care.  PT Short Term Goals Week 1:  PT Short Term Goal 1 (Week 1): Pt will consistently perform functional transfers with max A PT Short Term Goal 1 - Progress (Week 1): Progressing toward goal PT Short Term Goal 2 (Week 1): Pt perform sitting balance for functional task with min A PT Short Term Goal 2 - Progress (Week 1): Met   Skilled Therapeutic Interventions/Progress Updates: Ambulation/gait training;DME/adaptive equipment instruction;Neuromuscular re-education;Stair training;UE/LE Strength taining/ROM;Wheelchair propulsion/positioning;Therapeutic Activities;UE/LE Coordination activities;Functional electrical stimulation;Discharge planning;Balance/vestibular training;Cognitive remediation/compensation;Functional mobility training;Patient/family education;Splinting/orthotics;Therapeutic Exercise;Pain management    See Function Navigator for Current Functional Status.   Kidada Ging 06/02/2016, 2:19 PM

## 2016-06-02 NOTE — Progress Notes (Signed)
Physical Therapy Note  Patient Details  Name: [place] CraneOmar Hassing MRN: [id number] Date of Birth: 02/22/1991 Today's Date: 06/02/2016    Time: 1300-1412 72 minutes  1:1 no c/o pain.  Pt performed bed mobility with max A, increased time.  Transfers bed <> w/c <> mat with +2 assist, pt <25%.  Standing frame 2 x 10 minutes with reaching task focusing on upright posture with pt improving with repetition, still fatigues easily and resorts to forward flexion resting on UEs.  Sitting balance with focus on upright trunk and neck posture with pt able to maintain only 1-2 minutes before fatigue.  Pt placed in standard w/c, able to propel using hemi technique and min A for Rt LE coordination, max cuing for attention and coordination.  Pt still limited by fatigue but improving with functional mobility.   Hartley Urton 06/02/2016, 2:14 PM

## 2016-06-02 NOTE — Patient Care Conference (Signed)
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 FUO (fever of unknown origin)   . ESBL (extended spectrum beta-lactamase) producing bacteria infection   . Effusion of left knee   . Sepsis (HCC) 05/16/2016  . Tachycardia 05/16/2016  . S/P percutaneous endoscopic gastrostomy (PEG) tube placement (HCC) 05/16/2016  . Tracheostomy in place [facility]) 05/16/2016    Expected Discharge Date: Expected Discharge Date: 06/19/16  Team Members Present: Physician leading conference: Dr. Faith Rogue Social Worker Present: Amada Jupiter, LCSW Nurse Present: Carmie End, RN PT Present: Other (comment) Judieth Keens, PT) OT Present: Ardis Rowan, COTA;Jennifer Katrinka Blazing, OT SLP Present: Feliberto Gottron, SLP PPS Coordinator present : Tora Duck, RN, CRRN     Current Status/Progress Goal Weekly Team Focus  Medical   spasticity issues/pain. trach out today. ?voiding trial  continue streamline care for easier care at home  spasticity, trach, foley   Bowel/Bladder   foley for retention, incont/cont of bm. last bm 10/15  max assist, continue to monitor bowel pattern  monitor   Swallow/Nutrition/  Hydration   NPO with PEG  Mod A with least restrictive diet  FEES tomorrow to assess for possible diet initiation    ADL's   mod A bathing, mod-max UB dressing, total A LB dressing, total A toileting, max A of 2 for sit to stand and squat or stand pivot transfers  mod A transfer to [facility] with slide board, min A bed mobility during self care, min A bathing bed level (goals may change based on bathroom accessibility in d/c setting)  family education, bed mobility, sit balance, trunk control, LUE NMR, cognition, visual tracking   Mobility   +2 transfers, gait, standing with max A  mod A transfers, min A w/c mobility, mod A gait  transfers, family ed, activity tolerance   Communication   Patient decannulated, Max A to utilize verbalizatin and voicing   Min A  increased verbal initiation and intelligibility    Safety/Cognition/ Behavioral Observations  [place] Level V, Max-total A  Min A  attention, initiation, following commnads, orientation    Pain   occassional L knee pain- tylenol prn effective  0-minimal pain, medicate as needed  assess q shift & prn   Skin   PEG, trach, abrasions  free of infection or breakdown  monitor qshift & as needed    Rehab Goals Patient on target to meet rehab goals: Yes *See Care Plan and progress notes for long and short-term goals.  Barriers to Discharge: profound deficits as noted    Possible Resolutions to Barriers:  see prior    Discharge Planning/Teaching Needs:  Plan to d/c home  with family providing 24/7 assistance  Teaching ongoing.   Team Discussion:  Janina Mayo out today.  Plan trial of foley out tomorrow.  Pt signaled need for BM today and was successful.  Good gains overall.  Planning FEES tomorrow.  Goals to reach a 1 person assistance level.  Revisions to Treatment Plan:  None   Continued Need for Acute Rehabilitation Level of Care: The patient requires daily medical management by a physician with specialized training in physical medicine and  rehabilitation for the following conditions: Daily direction of a multidisciplinary physical rehabilitation program to ensure safe treatment while eliciting the highest outcome that is of practical value to the patient.: Yes Daily medical management of patient stability for increased activity during participation in an intensive rehabilitation regime.: Yes Daily analysis of laboratory values and/or radiology reports with any subsequent need for medication adjustment of medical intervention for : Neurological problems;Pulmonary problems  [name] [name] 06/02/2016, 4:20 PM
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
## 2016-06-02 NOTE — Progress Notes (Signed)
[place] PHYSICAL MEDICINE & REHABILITATION     PROGRESS NOTE    Subjective/Complaints: Sitting in bed. Appears comfortable. Denies pain today   ROS limited by cognition/language to an extent  Objective: Vital Signs: Blood pressure 125/85, pulse (!) 56, temperature 98 F (36.7 C), temperature source Oral, resp. rate 15, height 5\' 7"  (1.702 m), weight 76 kg (167 lb 8 oz), SpO2 100 %. No results found. No results for input(s): WBC, HGB, HCT, PLT in the last 72 hours.  Recent Labs  06/01/16 1727  NA 139  K 3.9  CL 99*  GLUCOSE 123*  BUN 53*  CREATININE 1.19  CALCIUM 9.5   CBG (last 3)   Recent Labs  05/31/16 0813  GLUCAP 82    Wt Readings from Last 3 Encounters:  06/02/16 76 kg (167 lb 8 oz)  05/25/16 78.2 kg (172 lb 8 oz)    Physical Exam:  HENT:  Head: Normocephalic.  Eyes:  Pupils sluggish to light Neck:  Tracheostomy tube in place, #4 Cardiovascular: Normal rateand regular rhythm.  Respiratory: Effort normaland breath sounds normal. Good air movement. No wheezes or rales.  GI: Soft. Bowel sounds are normal.  PEG tube intact Neurological: dysconjugate gaze. Tracks with right eye. Fol]lows simple one step commands. Understands no [place] . left hemiplegia--0/5 LUE. Perhaps 1/5 Left HF. 1/4 resting tone LUE. Left hamstrings/gastroc 2/4 with 5-6 beats of clonus. Engages with right hand.  Musculoskeletal: No evidence of effusion in bilateral knees. No pain with range of motion in the right knee, minimal discomfort with end range extension of the left knee. Patient has increased hamstring tone in the left lower extremity. No tenderness over the patella or the patellar tendon. Mild medial joint line tenderness on the left side only. No evidence of erythema Assessment/Plan: 1. Functional deficits secondary to TBI which require 3+ hours per day of interdisciplinary therapy in a comprehensive inpatient rehab setting. Physiatrist is providing close team  supervision and 24 hour management of active medical problems listed below. Physiatrist and rehab team continue to assess barriers to discharge/monitor patient progress toward functional and medical goals.  Function:  Bathing Bathing position   Position: Wheelchair/chair at sink  Bathing parts Body parts bathed by patient: Left arm, Chest, Front perineal area, Right upper leg, Left upper leg, Abdomen Body parts bathed by helper: Right arm, Buttocks, Right lower leg, Left lower leg, Back  Bathing assist Assist Level: 2 helpers      Upper Body Dressing/Undressing Upper body dressing   What is the patient wearing?: Pull over shirt/dress     Pull over shirt/dress - Perfomed by patient: Thread/unthread left sleeve Pull over shirt/dress - Perfomed by helper: Thread/unthread left sleeve, Put head through opening, Pull shirt over trunk        Upper body assist Assist Level:  (total assist)      Lower Body Dressing/Undressing Lower body dressing   What is the patient wearing?: Pants, Ted Hose, Shoes     Pants- Performed by patient: Thread/unthread right pants leg Pants- Performed by helper: Thread/unthread left pants leg, Pull pants up/down   Non-skid slipper socks- Performed by helper: Don/doff right sock, Don/doff left sock       Shoes - Performed by helper: Don/doff right shoe, Don/doff left shoe, Fasten right, Fasten left       TED Hose - Performed by helper: Don/doff right TED hose, Don/doff left TED hose  Lower body assist Assist for lower body dressing: 2 Helpers  Toileting Toileting     Toileting steps completed by helper: Adjust clothing prior to toileting, Performs perineal hygiene, Adjust clothing after toileting    Toileting assist Assist level: Two helpers   Transfers Chair/bed transfer   Chair/bed transfer method: Lateral scoot, Stand pivot Chair/bed transfer assist level: 2 helpers Chair/bed transfer assistive device: Mechanical lift Mechanical  lift: Max[place]y   Locomotion Ambulation Ambulation activity did not occur: Safety/medical concerns   Max distance: 8 Assist level: 2 helpers   Wheelchair   Type: Manual (TIS )   Assist Level: Dependent (Pt equals 0%)  Cognition Comprehension Comprehension assist level: Understands basic 50 - 74% of the time/ requires cueing 25 - 49% of the time  Expression Expression assist level: Expresses basic 25 - 49% of the time/requires cueing 50 - 75% of the time. Uses single words/gestures.  Social Interaction Social Interaction assist level: Interacts appropriately 25 - 49% of time - Needs frequent redirection.  Problem Solving Problem solving assist level: Solves basic 50 - 74% of the time/requires cueing 25 - 49% of the time  Memory Memory assist level: Recognizes or recalls 25 - 49% of the time/requires cueing 50 - 75% of the time   Medical Problem List and Plan: 1.  Weakness, left hemiparesthesia secondary to TBI 03/02/2016 complicated by sepsis and respiratory failure             -continue therapies---CIR PT, OT, speech  -team conference today 2.  DVT Prophylaxis/Anticoagulation: Subcutaneous Lovenox.  -vascular studies are normal 3. Pain Management: Tylenol as needed  - gabapentin 200mg  TID for neuropathic pain  -left knee pain ?neuropathic 4. Tracheostomy.PMV. Follow speech therapy  -#4 trach capped  -remove trach today 5. Neuropsych: This patient is not capable of making decisions on his own behalf. 6. Skin/Wound Care: Routine skin checks 7. Fluids/Electrolytes/Nutrition: all labs personally reviewed  -increased water through tube   -BUN/Cr slightly improved  -continue current plan  8. PEG tube.NPO. Progress per SLP 9. ESBL Klebsiella in sputum culture. Contact precautions 10.  HCAP. Antibiotic therapy completed monitor for any fever, Currently afebrile 11. Gout. 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 Bladder: foley in  place---d/w removal with team today in team conference    LOS (Days) 8 A FACE TO FACE EVALUATION WAS PERFORMED  SWARTZ,ZACHARY T 06/02/2016 8:47 AM

## 2016-06-02 NOTE — Progress Notes (Signed)
Speech Language Pathology Daily Session Note  Patient Details  Name: [name] [name] MRN: [id number] Date of Birth: 07/30/1991  Today's Date: 06/02/2016 SLP Individual Time: 1100-1200 SLP Individual Time Calculation (min): 60 min   Short Term Goals: Week 1: SLP Short Term Goal 1 (Week 1): Patient will consume trials of Dys. 1 textures without overt s/s of aspiration with Mod A verbal cues for use of swallowing compensatory strateiges over 3 sessions prior to repeat MBS.  SLP Short Term Goal 2 (Week 1): Patient will verbally answer questions in 75% of opportunties with Min A verbal cues.  SLP Short Term Goal 3 (Week 1): Patient will utilize speech intelligibility strategies at the phrase level with Mod A verbal cues to achieve 90% intelligibility. SLP Short Term Goal 4 (Week 1): Patient will wear PMSV with full supervision with all vitals remaining WFL.  SLP Short Term Goal 5 (Week 1): Patient will demonstrate sustained attention to tasks for 15 minutes with Mod A verbal cues for redirection.  SLP Short Term Goal 6 (Week 1): Patient will initiate functional tasks with Mod A verbal cues.   Skilled Therapeutic Interventions: Skilled therapy intervention focused on cognitive and dysphagia goals. Given Mod A verbal cues for occluding trach site, patient engaged in social conversation with selective attention for ~ 5 minutes. Patient demonstrated selective attention to self-feeding for ~ 15 minutes given Max A verbal cues for portion control and Mod A verbal cues for recall of swallowing strategies. Patient consumed trials of ice chips with no overt s/s of aspiration. When trialing Dys. 1 textures patient presented with wet vocal quality x2 and was given Max A verbal and visual cues for multiple swallows and throat clearing. Patient able to voice with greater strength following removal of trach and PMSV, demonstrating about 75% intelligibility at the phrase level. Patient left upright in whelelchair with  family and interpreter in room. Continue current plan of care.    Function:  Eating Eating   Modified Consistency Diet: Yes Eating Assist Level: Set up assist for;Supervision or verbal cues;Helper scoops food on utensil   Eating Set Up Assist For: Opening containers Helper Scoops Food on Utensil: Occasionally     Cognition Comprehension Comprehension assist level: Understands basic 50 - 74% of the time/ requires cueing 25 - 49% of the time  Expression   Expression assist level: Expresses basic 25 - 49% of the time/requires cueing 50 - 75% of the time. Uses single words/gestures.  Social Interaction Social Interaction assist level: Interacts appropriately 25 - 49% of time - Needs frequent redirection.  Problem Solving Problem solving assist level: Solves basic 50 - 74% of the time/requires cueing 25 - 49% of the time  Memory Memory assist level: Recognizes or recalls 25 - 49% of the time/requires cueing 50 - 75% of the time    Pain Pain Assessment Pain Assessment: No/denies pain  Therapy/Group: Individual Therapy  Caryn Sectionlison Yannick Steuber 06/02/2016, 3:02 PM

## 2016-06-02 NOTE — Progress Notes (Signed)
Occupational Therapy Session Note  Patient Details  Name: [place] CraneOmar Vanwagner MRN: [id number] Date of Birth: 11/14/1990  Today's Date: 06/02/2016 OT Individual Time: 1000-1105 OT Individual Time Calculation (min): 65 min     Short Term Goals:Week 1:  OT Short Term Goal 1 (Week 1): Pt will roll to L with mod A of 1 to assist caregivers with LB dressing.  OT Short Term Goal 2 (Week 1): Pt will roll to R with max A of 1 to assist caregivers with LB dressing. OT Short Term Goal 3 (Week 1): Pt will tolerate sitting EOB for 15 min with min A to engage in grooming activities. OT Short Term Goal 4 (Week 1): Pt will demonstrate improved motor planning with bathing with mod A bed level. OT Short Term Goal 5 (Week 1): Pt will complete slide board transfer with max A of 2 helpers.  Skilled Therapeutic Interventions/Progress Updates:    Pt seen for OT tx session with focus on attention to task, praxis with RUE, awareness of body positioning, functional mobility. Pt received in bed and nodded yes to a shower.  Min A to roll to R (with A to bend B knees), min to push into sit with using R arm.  Stand pivot to roll in shower chair.  Once chair rolled into shower, pt pointed to toilet and nodded yes when asked if he needed to go. Stand pivot to toilet +2 with cues to maintain upright posture.  Pt was able to have BM quickly, max A with pt helping to pull up to stand for clean up. Transferred back to shower chair. Pt's sister actively involved in assisting pt with shower, After shower pt wanted to toilet a 2nd time and was able to.  Transferred from toilet (BSC over toilet) to w/c.  Facilitation with forward wt shift to come up and +2 A to pivot hips to chair.  Improved attention and initiation with dressing UB with hand over hand guidance to start pulling shirt sleeve up.  Improved upright posture with sit to stand to pull pants over hips. Pt able to lift head fully, but only for a few minutes. Reminded family to measure  doorways of bathrooms and take cell pictures. Speech therapists arrived for next session.  Therapy Documentation Precautions:  Precautions Precautions: Fall Precaution Comments: PEG and trach Restrictions Weight Bearing Restrictions: No    Vital Signs: Therapy Vitals Temp: 98 F (36.7 C) Temp Source: Oral Pulse Rate: (!) 56 Resp: 15 BP: 125/85 Patient Position (if appropriate): Lying Oxygen Therapy SpO2: 100 % O2 Device: Not Delivered Pain:  c/o B knee pain with flexion, can not tolerate greater than 90 degrees of flexion (L pain > than R) ADL: ADL ADL Comments: refer to functional navigator See Function Navigator for Current Functional Status.   Therapy/Group: Individual Therapy  Ganesh Deeg 06/02/2016, 9:08 AM

## 2016-06-02 NOTE — Progress Notes (Signed)
Patient decannulation per MD order.  Gauze placed over stoma site and secured with cloth tape.  Patient tolerated well.

## 2016-06-03 ENCOUNTER — Inpatient Hospital Stay (HOSPITAL_COMMUNITY): Payer: Self-pay | Admitting: Occupational Therapy

## 2016-06-03 ENCOUNTER — Inpatient Hospital Stay (HOSPITAL_COMMUNITY): Payer: Medicaid Other | Admitting: Physical Therapy

## 2016-06-03 ENCOUNTER — Inpatient Hospital Stay (HOSPITAL_COMMUNITY): Payer: Medicaid Other | Admitting: Occupational Therapy

## 2016-06-03 ENCOUNTER — Inpatient Hospital Stay (HOSPITAL_COMMUNITY): Payer: Medicaid Other | Admitting: Speech Pathology

## 2016-06-03 MED ORDER — PRO-STAT SUGAR FREE PO LIQD
30.0000 mL | Freq: Two times a day (BID) | ORAL | Status: DC
  Administered 2016-06-03 – 2016-06-04 (×3): 30 mL
  Administered 2016-06-05: 23:00:00
  Administered 2016-06-05 – 2016-06-18 (×26): 30 mL
  Filled 2016-06-03 (×30): qty 30

## 2016-06-03 MED ORDER — JEVITY 1.5 CAL/FIBER PO LIQD
350.0000 mL | Freq: Three times a day (TID) | ORAL | Status: DC
  Administered 2016-06-03 – 2016-06-04 (×2): 350 mL
  Filled 2016-06-03 (×9): qty 1000
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
## 2016-06-03 NOTE — Progress Notes (Signed)
Physical Therapy Session Note  Patient Details  Name: [name] CraneOmar Malkowski MRN: [id number] Date of Birth: 06/15/1991  Today's Date: 06/03/2016 PT Individual Time: 1305-1430 PT Individual Time Calculation (min): 85 min    Skilled Therapeutic Interventions/Progress Updates:    Pt received in w/c & agreeable to tx, denying c/o pain. Professional interpreter present for session. Pt transported room>gym via w/c total assist for time management. Utilized standing frame for BLE weight bearing & to focus on balance, posture. Pt stood x 4 minutes + 7 minutes in standing frame. Pt was able to stand without RUE support x 15 seconds maximum focusing on balance without UE support. Utilized Ship brokermirror for visual feedback for postural control. Pt's lunch arrived & pt returned to room via w/c total assist. Provided max cuing and assistance for small sips & bites & two swallows between bites/sips. Pt & family thoroughly educated on need to eat in this manner to prevent choking & aspiration. Provided family with handout on how to build a ramp; will need to follow up with pt's sister & go over instructions with her as pt will be discharging to her house & not his mother's. Instructed pt in squat pivot to R w/c>bed with + 2 assistance and max multimodal cuing for anterior weight shifting. Pt with poor understanding of head/hips relationship to assist with transfer. Pt required max cuing for anterior weight shift to scoot buttocks back on edge of bed. Instructed pt in using RLE to assist with lifting LLE but pt with poor demo. Pt transferred sitting EOB>supine with total max assist +2. Instructed pt in positioning of LE & UE with bed in trendelenburg position to assist with scooting to head of bed. Pt requires significantly extra time to process instructions and to perform task but able to scoot to head of bed with min assist. Therapist donned L PRAFO boot total assist. At end of session pt left in bed with all needs within reach & alarm  set.   Instructed pt's family on need to limit distractions, provide simple commands and allow pt extra time to complete tasks as he needs extra time to process information/instructions.   Therapy Documentation Precautions:  Precautions Precautions: Fall Precaution Comments: PEG Restrictions Weight Bearing Restrictions: No    See Function Navigator for Current Functional Status.   Therapy/Group: Individual Therapy  Sandi MariscalVictoria M Dewaine Morocho 06/03/2016, 2:45 PM

## 2016-06-03 NOTE — Progress Notes (Addendum)
Speech Language Pathology Weekly Progress Note  Patient Details  Name: [name] [name] MRN: [id number] Date of Birth: 1991/05/19  Beginning of progress report period: May 25, 2016 End of progress report period: June 03, 2016   Short Term Goals: Week 1: SLP Short Term Goal 1 (Week 1): Patient will consume trials of Dys. 1 textures without overt s/s of aspiration with Mod A verbal cues for use of swallowing compensatory strateiges over 3 sessions prior to repeat MBS.  SLP Short Term Goal 1 - Progress (Week 1): Met SLP Short Term Goal 2 (Week 1): Patient will verbally answer questions in 75% of opportunties with Min A verbal cues.  SLP Short Term Goal 2 - Progress (Week 1): Not met SLP Short Term Goal 3 (Week 1): Patient will utilize speech intelligibility strategies at the phrase level with Mod A verbal cues to achieve 90% intelligibility. SLP Short Term Goal 3 - Progress (Week 1): Not met SLP Short Term Goal 4 (Week 1): Patient will wear PMSV with full supervision with all vitals remaining WFL.  SLP Short Term Goal 4 - Progress (Week 1): Met SLP Short Term Goal 5 (Week 1): Patient will demonstrate sustained attention to tasks for 15 minutes with Mod A verbal cues for redirection.  SLP Short Term Goal 5 - Progress (Week 1): Met SLP Short Term Goal 6 (Week 1): Patient will initiate functional tasks with Mod A verbal cues.  SLP Short Term Goal 6 - Progress (Week 1): Met    New Short Term Goals: Week 2: SLP Short Term Goal 1 (Week 2): Patient will consume current diet with minimal overt s/s of aspiration with Mod A verbal and question cues.  SLP Short Term Goal 2 (Week 2): Patient will verbally answer questions in 75% of opportunties with Mod A verbal cues.  SLP Short Term Goal 3 (Week 2): Patient will utilize speech intelligibility strategies at the phrase level with Mod A verbal cues to achieve 90% intelligibility. SLP Short Term Goal 4 (Week 2): Patient will demonstrate sustained attention  to task for ~30 minutes with Min A verbal cues for redirection.  SLP Short Term Goal 5 (Week 2): Patient will demonstrate functional problem solving with basic and familiar tasks with Mod A verbal and visual cues.  SLP Short Term Goal 6 (Week 2): Patient will orient to time, place and situation with Min A verbal cues.   Weekly Progress Updates: Patient has made functional gains and has met 4 of 6 STG's this reporting period due to improved swallowing and cognitive function. Patient is currently decannulated and requires Mod-Max A verbal cues for verbal initiation and use of an increased vocal intensity with over articulation in order to maximize his speech intelligibility. Patient has been consuming trials of Dys. 1 textures and ice chips with minimal overt s/s of aspiration and Mod-Max A verbal cues for use of swallowing compensatory strategies. Patient had repeat FEES today and initiated a Dys. 1 diet with honey-thick liquids with continued silent aspiration of thin liquids. Patient also demonstrates behaviors consistent with a Rancho Level V-emerging VI and requires overall Max A multimodal cues to complete functional and familiar tasks in regards to attention, problem solving, recall and awareness. Patient and family education is ongoing. Patient would benefit from continued skilled SLP intervention to maximize his functional communication, swallowing function and cognitive function in order to maximize her overall functional independence.     Intensity: Minumum of 1-2 x/day, 30 to 90 minutes Frequency: 3 to 5 out  of 7 days Duration/Length of Stay: 11/3 Treatment/Interventions: Cognitive remediation/compensation;Cueing hierarchy;Functional tasks;Patient/family education;Environmental controls;Therapeutic Activities;Dysphagia/aspiration precaution training;Internal/external aids;Speech/Language facilitation    Nic Lampe 06/03/2016, 4:12 PM
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
## 2016-06-03 NOTE — Progress Notes (Signed)
Occupational Therapy Weekly Progress Note  Patient Details  Name: [name] [name] MRN: [id number] Date of Birth: 02/22/1991  Beginning of progress report period: May 26, 2016 End of progress report period: June 03, 2016  Today's Date: 06/03/2016 OT Individual Time: 1100-1205 OT Individual Time Calculation (min): 65 min     Patient has met 5 of 5 short term goals.  Pt has been participating and making progress with his attention, motor planning, trunk control, slight active movement in L elbow/ finger flexors. He can now sit with min support edge of mat for 20-25 minutes.  He can actively lift head fully, but has limited endurance to hold head in position.  He is consistently transferring max A of 2 helpers with squat pivot and comes to a stand with max A of 2 helpers.  Patient continues to demonstrate the following deficits: left hemiparesis, R apraxia, decreased head/neck/ trunk control, slight Left lean, poor activity tolerance, decreased attention and initiation and therefore will continue to benefit from skilled OT intervention to enhance overall performance with BADL and Reduce care partner burden.  Patient progressing toward long term goals..  Plan of care revisions: Added goals of max A of standing balance, min A UB dressing, mod A LB dressing..  OT Short Term Goals Week 1:  OT Short Term Goal 1 (Week 1): Pt will roll to L with mod A of 1 to assist caregivers with LB dressing.  OT Short Term Goal 1 - Progress (Week 1): Met OT Short Term Goal 2 (Week 1): Pt will roll to R with max A of 1 to assist caregivers with LB dressing. OT Short Term Goal 2 - Progress (Week 1): Met OT Short Term Goal 3 (Week 1): Pt will tolerate sitting EOB for 15 min with min A to engage in grooming activities. OT Short Term Goal 3 - Progress (Week 1): Met OT Short Term Goal 4 (Week 1): Pt will demonstrate improved motor planning with bathing with mod A bed level. OT Short Term Goal 4 - Progress (Week 1):  Met OT Short Term Goal 5 (Week 1): Pt will complete slide board transfer with max A of 2 helpers. OT Short Term Goal 5 - Progress (Week 1): Met (not using slide board, max A of 2 squat pivot) Week 2:  OT Short Term Goal 1 (Week 2): max A of 1 for squat pivot to toilet with grab bar (BSC over toilet for height) OT Short Term Goal 2 (Week 2): max A of 1 to push into full stand for toileting/ LB dressing OT Short Term Goal 3 (Week 2): max A of 1 to maintain static standing balance while caregiver pulls pants over hips OT Short Term Goal 4 (Week 2): mod cues to fully attend to LUE during UB dressing   Skilled Therapeutic Interventions/Progress Updates:    Pt taken to therapy gym to work on trunk control, attention, LUE wt bearing, RUE motor planning/coordination, NMR for LUE with tapping over muscle belly.  Pt received in bed, worked on rolling to R with min A and pushing up with R arm with min-mod A. Max A sq pivot to w/c +2. PT placed new back on w/c for increased trunk support.  Half lap tray used to support L arm. Pt brushed hair using mirror with mod cues to fully complete task. Transferred to mat and tolerated sitting for over 40 min fluctuating from close S to min A.  Hand over hand guiding for grasping of cones with support   Occupational Therapy Weekly Progress Note  Patient Details  Name: [name] [name] MRN: [id number] Date of Birth: 02/22/1991  Beginning of progress report period: May 26, 2016 End of progress report period: June 03, 2016  Today's Date: 06/03/2016 OT Individual Time: 1100-1205 OT Individual Time Calculation (min): 65 min     Patient has met 5 of 5 short term goals.  Pt has been participating and making progress with his attention, motor planning, trunk control, slight active movement in L elbow/ finger flexors. He can now sit with min support edge of mat for 20-25 minutes.  He can actively lift head fully, but has limited endurance to hold head in position.  He is consistently transferring max A of 2 helpers with squat pivot and comes to a stand with max A of 2 helpers.  Patient continues to demonstrate the following deficits: left hemiparesis, R apraxia, decreased head/neck/ trunk control, slight Left lean, poor activity tolerance, decreased attention and initiation and therefore will continue to benefit from skilled OT intervention to enhance overall performance with BADL and Reduce care partner burden.  Patient progressing toward long term goals..  Plan of care revisions: Added goals of max A of standing balance, min A UB dressing, mod A LB dressing..  OT Short Term Goals Week 1:  OT Short Term Goal 1 (Week 1): Pt will roll to L with mod A of 1 to assist caregivers with LB dressing.  OT Short Term Goal 1 - Progress (Week 1): Met OT Short Term Goal 2 (Week 1): Pt will roll to R with max A of 1 to assist caregivers with LB dressing. OT Short Term Goal 2 - Progress (Week 1): Met OT Short Term Goal 3 (Week 1): Pt will tolerate sitting EOB for 15 min with min A to engage in grooming activities. OT Short Term Goal 3 - Progress (Week 1): Met OT Short Term Goal 4 (Week 1): Pt will demonstrate improved motor planning with bathing with mod A bed level. OT Short Term Goal 4 - Progress (Week 1):  Met OT Short Term Goal 5 (Week 1): Pt will complete slide board transfer with max A of 2 helpers. OT Short Term Goal 5 - Progress (Week 1): Met (not using slide board, max A of 2 squat pivot) Week 2:  OT Short Term Goal 1 (Week 2): max A of 1 for squat pivot to toilet with grab bar (BSC over toilet for height) OT Short Term Goal 2 (Week 2): max A of 1 to push into full stand for toileting/ LB dressing OT Short Term Goal 3 (Week 2): max A of 1 to maintain static standing balance while caregiver pulls pants over hips OT Short Term Goal 4 (Week 2): mod cues to fully attend to LUE during UB dressing   Skilled Therapeutic Interventions/Progress Updates:    Pt taken to therapy gym to work on trunk control, attention, LUE wt bearing, RUE motor planning/coordination, NMR for LUE with tapping over muscle belly.  Pt received in bed, worked on rolling to R with min A and pushing up with R arm with min-mod A. Max A sq pivot to w/c +2. PT placed new back on w/c for increased trunk support.  Half lap tray used to support L arm. Pt brushed hair using mirror with mod cues to fully complete task. Transferred to mat and tolerated sitting for over 40 min fluctuating from close S to min A.  Hand over hand guiding for grasping of cones with support

## 2016-06-03 NOTE — Progress Notes (Signed)
Social Work Patient ID: [name] [name], male   DOB: 07/29/1991, 25 y.o.   MRN: [id number]   With interpreter's assistance, I reviewed team conference with pt and mother today.  They are aware that 11/3 is targeted d/c date with goals of being one person assistance at w/c level.  I have also called pt's niece, [name] [name], and reviewed the information.  Hope to speak with pt's sister when here over the next couple of days.  Have confirmed that pt is to d/c home with his sister in [place].  Stressed to niece that we must get the home measurement sheet back ASAP.  Continue to follow.  Khrystyna Schwalm, LCSW

## 2016-06-03 NOTE — Progress Notes (Signed)
Foley removed at 1630. Will continue to monitor bladder pattern.

## 2016-06-03 NOTE — Progress Notes (Addendum)
Occupational Therapy Session Note  Patient Details  Name: [name] [name] MRN: [id number] Date of Birth: 01/16/1991  Today's Date: 06/03/2016 OT Individual Time: 1430-1500 OT Individual Time Calculation (min): 30 min     Short Term Goals: Week 2:  OT Short Term Goal 1 (Week 2): max A of 1 for squat pivot to toilet with grab bar (BSC over toilet for height) OT Short Term Goal 2 (Week 2): max A of 1 to push into full stand for toileting/ LB dressing OT Short Term Goal 3 (Week 2): max A of 1 to maintain static standing balance while caregiver pulls pants over hips OT Short Term Goal 4 (Week 2): mod cues to fully attend to LUE during UB dressing  Skilled Therapeutic Interventions/Progress Updates:   1:1 OT session focused on L UE NMR, interpreter present throughout session. Sidelying L UE NMre-ed to elicit proximal to distal movement. PNF patterns in sidelying, + Joint input to bring pt through full ROM. Pt lethargic during session but able to maintain alert state with min VC and environmental changes. Pt returned to supine at end of session and left with 4 bed rails up and bed alarm on per safety plan.   Therapy Documentation Precautions:  Precautions Precautions: Fall Precaution Comments: PEG Restrictions Weight Bearing Restrictions: No Pain: Pain Assessment Pain Assessment: No/denies pain Pain Score: 0-No pain ADL: ADL ADL Comments: refer to functional navigator  See Function Navigator for Current Functional Status.   Therapy/Group: Individual Therapy  [name] [name] 06/03/2016, 3:52 PM

## 2016-06-04 ENCOUNTER — Inpatient Hospital Stay (HOSPITAL_COMMUNITY): Payer: Medicaid Other | Admitting: Occupational Therapy

## 2016-06-04 ENCOUNTER — Inpatient Hospital Stay (HOSPITAL_COMMUNITY): Payer: Medicaid Other | Admitting: Physical Therapy

## 2016-06-04 ENCOUNTER — Inpatient Hospital Stay (HOSPITAL_COMMUNITY): Payer: Medicaid Other | Admitting: Speech Pathology

## 2016-06-04 ENCOUNTER — Inpatient Hospital Stay (HOSPITAL_COMMUNITY): Payer: Self-pay | Admitting: Physical Therapy

## 2016-06-04 LAB — BASIC METABOLIC PANEL
Anion gap: 12 (ref 5–15)
BUN: 34 mg/dL — ABNORMAL HIGH (ref 6–20)
CALCIUM: 9.6 mg/dL (ref 8.9–10.3)
CO2: 28 mmol/L (ref 22–32)
CREATININE: 1.34 mg/dL — AB (ref 0.61–1.24)
Chloride: 98 mmol/L — ABNORMAL LOW (ref 101–111)
Glucose, Bld: 80 mg/dL (ref 65–99)
Potassium: 3.8 mmol/L (ref 3.5–5.1)
SODIUM: 138 mmol/L (ref 135–145)

## 2016-06-04 MED ORDER — BACLOFEN 10 MG PO TABS
10.0000 mg | ORAL_TABLET | Freq: Two times a day (BID) | ORAL | Status: DC
  Administered 2016-06-04 – 2016-06-12 (×17): 10 mg
  Filled 2016-06-04 (×16): qty 1

## 2016-06-04 MED ORDER — JEVITY 1.5 CAL/FIBER PO LIQD
350.0000 mL | Freq: Three times a day (TID) | ORAL | Status: DC
  Administered 2016-06-04 – 2016-06-08 (×9): 350 mL
  Filled 2016-06-04 (×20): qty 1000

## 2016-06-04 MED ORDER — FREE WATER
250.0000 mL | Freq: Four times a day (QID) | Status: DC
  Administered 2016-06-04 – 2016-06-17 (×51): 250 mL

## 2016-06-04 NOTE — Progress Notes (Signed)
Occupational Therapy Session Note  Patient Details  Name: [place] CraneOmar Moretto MRN: [id number] Date of Birth: 03/21/1991  Today's Date: 06/04/2016 OT Individual Time: 1100-1200 OT Individual Time Calculation (min): 60 min     Short Term Goals:Week 2:  OT Short Term Goal 1 (Week 2): max A of 1 for squat pivot to toilet with grab bar (BSC over toilet for height) OT Short Term Goal 2 (Week 2): max A of 1 to push into full stand for toileting/ LB dressing OT Short Term Goal 3 (Week 2): max A of 1 to maintain static standing balance while caregiver pulls pants over hips OT Short Term Goal 4 (Week 2): mod cues to fully attend to LUE during UB dressing  Skilled Therapeutic Interventions/Progress Updates:    Pt seen for OT tx to address: Functional mobility - squat pivot transfer to mat with max A of 2, scooting to L/R on mat with max A.   Pt stated he needed to toilet.  Toilet transfer to R max of 2, total A of 2 to stand (pt not participating well, keeping head down, shoulders hunched). Pt was continent of bowel and used toilet, total A to L back to chair of 2.   W/c to bed to R- max A of 2 Sitting balance EOB - min A for upright posture for 5 minutes LUE NMR to facilitate elb flex - arm place on tray table. Pt did NOT want to participate and tried to push this therapist away with his R hand. Told pt he could not lay down in bed until he actively moved his elbow. Pt actively flexed his L elbow 50 degrees (therapist extended arm) 5 x.   Pt adjusted in bed and reviewed with pt that he has to put his best effort in to progress to go home more independently. Bed alarm set.   Therapy Documentation Precautions:  Precautions Precautions: Fall Precaution Comments: PEG Restrictions Weight Bearing Restrictions: No    Vital Signs: Therapy Vitals Pulse Rate: (!) 54 Resp: 16 BP: 103/71 Patient Position (if appropriate): Lying Pain:  no c/o pain, c/o severe itching - pt has medicated lotion that was  used ADL: ADL ADL Comments: refer to functional navigator  See Function Navigator for Current Functional Status.   Therapy/Group: Individual Therapy  Akhil Piscopo 06/04/2016, 8:31 AM

## 2016-06-04 NOTE — Progress Notes (Signed)
06/04/16 1420 nursing ST said that patient not eating 50% of food and family is concerned that he is not getting enough food. Dan PA notified new orders noted.

## 2016-06-04 NOTE — Plan of Care (Signed)
Problem: RH BLADDER ELIMINATION Goal: RH STG MANAGE BLADDER WITH ASSISTANCE STG Manage Bladder With total Assistance   Outcome: Not Progressing Pt retaining urine; doing pvr and straight cath

## 2016-06-04 NOTE — Progress Notes (Signed)
Physical Therapy Note  Patient Details  Name: [name] [name] MRN: [id number] Date of Birth: 08/14/1991 Today's Date: 06/04/2016    Time: 1300-1330 30 minutes  1:1 Pt c/o pain in LEs with activity, eased with rest, pt medicated before session. Supine to sit with mod A, increased time and cuing. Transfer to w/c with +2 assist, increased manual and verbal and gesture cues needed to follow through with task.  W/c mobility with mod A for using Rt LE to steer, pt with no awareness of running into obstacles, no self correction noted. Pt engaged in eating lunch with mod cuing for small bites, no coughing. Pt able to verbalize more regarding likes and dislikes of types of food.   Twan Harkin 06/04/2016, 1:56 PM
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
## 2016-06-04 NOTE — Progress Notes (Signed)
Physical Therapy Session Note  Patient Details  Name: [name] [name] MRN: [id number] Date of Birth: 03/05/1991  Today's Date: 06/04/2016 PT Individual Time: 1000-1105 PT Individual Time Calculation (min): 65 min    Skilled Therapeutic Interventions/Progress Updates:    Professional interpreter present for session.   Interpreter noted he already interpreted Ramp building instruction to family member & family member reports he has no questions/concerns regarding building a ramp. Pt's family provided therapist with home measurements: bathroom doorway = 28 inches, bedroom doorway = 27 inches, main door to house = 32 inches. Pt's current w/c is 25 inches wide and will fit into pt's home.   Pt received in bed & agreeable to PT, noting 5-6/10 pain in LLE & RN made aware. Pt required total assist to don pants but able to roll L<>R with max multimodal cuing for BLE positioning and sequencing with use of bed rails. Instructed pt in supine>R sidelying>sitting EOB with max cuing for technique & max assist for task. Instructed pt in anterior weight shift and technique for squat pivot bed>w/c with +2 assist for safety. Instructed interpreter & pt's family on need for minimal instructions and to allow pt time to process information/instructions. Once pt in w/c he reported need to use bathroom; transported pt into bathroom & observed him to have incontinent BM. Therapist & rehab tech provided max + 2 assist for stand pivot w/c>toilet and total assist +2 for hygiene. Instructed pt in sit>stand with steady but pt not participating even with max encouragement, education and multiple trials. Pt required +3 assist to transfer sit>stand in steady to allow therapist to don clothing total assist. Pt scratching dry skin on back & removed foam dressing & caused skin to bleed, RN made aware who applied dressing. Pt transferred to w/c from steady with max assist +2. Instructed pt on importance of participating in functional tasks.  At end of session pt left sitting in w/c with family & interpreter present & OT entering room.   Therapy Documentation Precautions:  Precautions Precautions: Fall Precaution Comments: PEG Restrictions Weight Bearing Restrictions: No   See Function Navigator for Current Functional Status.   Therapy/Group: Individual Therapy  Sandi MariscalVictoria M Yan Pankratz 06/04/2016, 12:22 PM

## 2016-06-04 NOTE — Progress Notes (Signed)
Speech Language Pathology Daily Session Note  Patient Details  Name: [name] [name] MRN: [id number] Date of Birth: 04/27/1991  Today's Date: 06/04/2016 SLP Individual Time: 1330-1430 SLP Individual Time Calculation (min): 60 min   Short Term Goals: Week 2: SLP Short Term Goal 1 (Week 2): Patient will consume current diet with minimal overt s/s of aspiration with Mod A verbal and question cues.  SLP Short Term Goal 2 (Week 2): Patient will verbally answer questions in 75% of opportunties with Mod A verbal cues.  SLP Short Term Goal 3 (Week 2): Patient will utilize speech intelligibility strategies at the phrase level with Mod A verbal cues to achieve 90% intelligibility. SLP Short Term Goal 4 (Week 2): Patient will demonstrate sustained attention to task for ~30 minutes with Min A verbal cues for redirection.  SLP Short Term Goal 5 (Week 2): Patient will demonstrate functional problem solving with basic and familiar tasks with Mod A verbal and visual cues.  SLP Short Term Goal 6 (Week 2): Patient will orient to time, place and situation with Min A verbal cues.   Skilled Therapeutic Interventions: Skilled treatment session focused on dysphagia and speech goals. SLP facilitated session by providing Max A verbal and tactile cues for portion control and use of multiple swallows with honey-thick liquids. Patient consumed liquids without overt s/s of aspiration. Patient and family re-educated in regards to results from FEES, diet recommendations, appropriate textures and swallowing compensatory strategies. Patient's mother verbalized understanding. Patient also required Max-Total A for verbal reasoning and Mod A verbal cues for use of intelligibility strategies at the phrase level during a verbal description task. However, patient only required Min A for naming when given a semantic clue. Patient transferred back to bed with +2 assist. Patient left supine in bed with all needs within reach. Continue with  current plan of care.   Function:  Eating Eating Eating activity did not occur: Safety/medical concerns Modified Consistency Diet: Yes Eating Assist Level: Supervision or verbal cues;More than reasonable amount of time;Set up assist for;Helper scoops food on utensil;Help managing cup/glass   Eating Set Up Assist For: Opening containers Helper Scoops Food on Utensil: Occasionally Helper Brings Food to Mouth: Occasionally   Cognition Comprehension Comprehension assist level: Understands basic 25 - 49% of the time/ requires cueing 50 - 75% of the time  Expression Expression assistive device: Other (Comment) Expression assist level: Expresses basic 25 - 49% of the time/requires cueing 50 - 75% of the time. Uses single words/gestures.  Social Interaction Social Interaction assist level: Interacts appropriately 25 - 49% of time - Needs frequent redirection.  Problem Solving Problem solving assist level: Solves basic less than 25% of the time - needs direction nearly all the time or does not effectively solve problems and may need a restraint for safety  Memory Memory assist level: Recognizes or recalls less than 25% of the time/requires cueing greater than 75% of the time    Pain Pain Assessment Pain Assessment: Faces Faces Pain Scale: Hurts even more Pain Type: Neuropathic pain Pain Location: Leg Pain Orientation: Right;Left Pain Descriptors / Indicators: Aching Pain Onset: On-going Patients Stated Pain Goal: 0 Pain Intervention(s): Medication (See eMAR)  Therapy/Group: Individual Therapy  Breniyah Romm 06/04/2016, 3:30 PM

## 2016-06-05 ENCOUNTER — Inpatient Hospital Stay (HOSPITAL_COMMUNITY): Payer: Medicaid Other | Admitting: Speech Pathology

## 2016-06-05 ENCOUNTER — Inpatient Hospital Stay (HOSPITAL_COMMUNITY): Payer: Medicaid Other | Admitting: Occupational Therapy

## 2016-06-05 ENCOUNTER — Inpatient Hospital Stay (HOSPITAL_COMMUNITY): Payer: Self-pay | Admitting: Physical Therapy

## 2016-06-05 MED ORDER — MEGESTROL ACETATE 400 MG/10ML PO SUSP
400.0000 mg | Freq: Every day | ORAL | Status: DC
  Administered 2016-06-05 – 2016-06-17 (×13): 400 mg via ORAL
  Filled 2016-06-05 (×12): qty 10

## 2016-06-05 MED ORDER — GABAPENTIN 300 MG PO CAPS
300.0000 mg | ORAL_CAPSULE | Freq: Three times a day (TID) | ORAL | Status: DC
  Administered 2016-06-05 – 2016-06-08 (×9): 300 mg via ORAL
  Filled 2016-06-05 (×9): qty 1

## 2016-06-05 NOTE — Progress Notes (Signed)
[place] PHYSICAL MEDICINE & REHABILITATION     PROGRESS NOTE    Subjective/Complaints: No new issues reported. Left arm (and leg?) still tingling ROS limited by cognition/language to an extent  Objective: Vital Signs: Blood pressure 121/72, pulse 84, temperature 97.2 F (36.2 C), temperature source Axillary, resp. rate 16, height 5\' 7"  (1.702 m), weight 75.6 kg (166 lb 11.2 oz), SpO2 98 %. No results found. No results for input(s): WBC, HGB, HCT, PLT in the last 72 hours.  Recent Labs  06/04/16 0632  NA 138  K 3.8  CL 98*  GLUCOSE 80  BUN 34*  CREATININE 1.34*  CALCIUM 9.6   CBG (last 3)  No results for input(s): GLUCAP in the last 72 hours.  Wt Readings from Last 3 Encounters:  06/03/16 75.6 kg (166 lb 11.2 oz)  05/25/16 78.2 kg (172 lb 8 oz)    Physical Exam:  HENT:  Head: Normocephalic.  Eyes:  Pupils sluggish to light Neck:  Trach stoma closed Cardiovascular: Normal rateand regular rhythm.  Respiratory: Effort normaland breath sounds normal. Good air movement. No wheezes or rales.  GI: Soft. Bowel sounds are normal.  PEG tube intact Neurological: dysconjugate gaze. Tracks with right eye. Fol]lows simple one step commands. Understands no [place] . left hemiplegia--0/5 LUE. Perhaps 1/5 Left HF. 1+/4 resting tone LUE. Left hamstrings/gastroc 2/4 with 5-6 beats of clonus. Engages with right hand.  Musculoskeletal: No evidence of effusion in bilateral knees. No pain with range of motion in the right knee, minimal discomfort with end range extension of the left knee. Patient has increased hamstring tone in the left lower extremity. No tenderness over the patella or the patellar tendon. Mild medial joint line tenderness on the left side only. No evidence of erythema Assessment/Plan: 1. Functional deficits secondary to TBI which require 3+ hours per day of interdisciplinary therapy in a comprehensive inpatient rehab setting. Physiatrist is providing close team  supervision and 24 hour management of active medical problems listed below. Physiatrist and rehab team continue to assess barriers to discharge/monitor patient progress toward functional and medical goals.  Function:  Bathing Bathing position   Position: Shower (roll in chair)  Bathing parts Body parts bathed by patient: Left arm, Chest, Front perineal area, Right upper leg, Left upper leg, Abdomen Body parts bathed by helper: Right arm, Buttocks, Right lower leg, Left lower leg, Back  Bathing assist Assist Level: 2 helpers      Upper Body Dressing/Undressing Upper body dressing   What is the patient wearing?: Pull over shirt/dress     Pull over shirt/dress - Perfomed by patient: Thread/unthread left sleeve, Put head through opening Pull over shirt/dress - Perfomed by helper: Thread/unthread right sleeve, Pull shirt over trunk        Upper body assist Assist Level:  (total assist)      Lower Body Dressing/Undressing Lower body dressing   What is the patient wearing?: Pants, Ted Hose, Shoes     Pants- Performed by patient: Thread/unthread right pants leg Pants- Performed by helper: Thread/unthread left pants leg, Pull pants up/down   Non-skid slipper socks- Performed by helper: Don/doff right sock, Don/doff left sock       Shoes - Performed by helper: Don/doff right shoe, Don/doff left shoe, Fasten right, Fasten left       TED Hose - Performed by helper: Don/doff right TED hose, Don/doff left TED hose  Lower body assist Assist for lower body dressing: 2 Helpers      Toileting  Toileting     Toileting steps completed by helper: Performs perineal hygiene, Adjust clothing prior to toileting, Adjust clothing after toileting    Toileting assist Assist level: Two helpers   Transfers Chair/bed transfer   Chair/bed transfer method: Squat pivot Chair/bed transfer assist level: 2 helpers Chair/bed transfer assistive device: Bedrails Mechanical lift: Corporate treasurer Ambulation activity did not occur: Safety/medical concerns   Max distance: 8 Assist level: 2 helpers   Wheelchair   Type: Manual (TIS )   Assist Level: Dependent (Pt equals 0%)  Cognition Comprehension Comprehension assist level: Understands basic 25 - 49% of the time/ requires cueing 50 - 75% of the time  Expression Expression assist level: Expresses basic 25 - 49% of the time/requires cueing 50 - 75% of the time. Uses single words/gestures.  Social Interaction Social Interaction assist level: Interacts appropriately 25 - 49% of time - Needs frequent redirection.  Problem Solving Problem solving assist level: Solves basic less than 25% of the time - needs direction nearly all the time or does not effectively solve problems and may need a restraint for safety  Memory Memory assist level: Recognizes or recalls less than 25% of the time/requires cueing greater than 75% of the time   Medical Problem List and Plan: 1.  Weakness, left hemiparesthesia secondary to TBI 03/02/2016 complicated by sepsis and respiratory failure             -continue therapies---CIR PT, OT, speech  -have extended patient's stay given the progress he's showing 2.  DVT Prophylaxis/Anticoagulation: Subcutaneous Lovenox.  -vascular studies are normal 3. Pain Management: Tylenol as needed  - gabapentin ---increase to 300mg  for neuropathic left-sided pain    4. Tracheostomy: trach site healing nicely 5. Neuropsych: This patient is not capable of making decisions on his own behalf. 6. Skin/Wound Care: Routine skin checks 7. Fluids/Electrolytes/Nutrition: labs improving  - 8. Dysphagia--now on D1/honeys-  -ate little yesterday  -add megace for appetite  -scheduled formula has been stopped  -continue with reduced flushes  -keep protein supp  -recheck BMET on Monday  9. ESBL Klebsiella in sputum culture. Contact precautions 10.  HCAP. Antibiotic therapy completed monitor for any fever,  Currently afebrile 11. Gout. 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 Bladder/bowel: continue voiding trial  -requiring intermittent caths at times/ otherwise incontinent  -remains incontinent of stool also     LOS (Days) 11 A FACE TO FACE EVALUATION WAS PERFORMED  Maddi Collar T 06/05/2016 8:27 AM

## 2016-06-05 NOTE — Progress Notes (Addendum)
Speech Language Pathology Daily Session Note  Patient Details  Name: [name] [name] MRN: [id number] Date of Birth: 02/06/1991  Today's Date: 06/05/2016 SLP Individual Time: 1100-1200 SLP Individual Time Calculation (min): 60 min   Short Term Goals: Week 2: SLP Short Term Goal 1 (Week 2): Patient will consume current diet with minimal overt s/s of aspiration with Mod A verbal and question cues.  SLP Short Term Goal 2 (Week 2): Patient will verbally answer questions in 75% of opportunties with Mod A verbal cues.  SLP Short Term Goal 3 (Week 2): Patient will utilize speech intelligibility strategies at the phrase level with Mod A verbal cues to achieve 90% intelligibility. SLP Short Term Goal 4 (Week 2): Patient will demonstrate sustained attention to task for ~30 minutes with Min A verbal cues for redirection.  SLP Short Term Goal 5 (Week 2): Patient will demonstrate functional problem solving with basic and familiar tasks with Mod A verbal and visual cues.  SLP Short Term Goal 6 (Week 2): Patient will orient to time, place and situation with Min A verbal cues.   Skilled Therapeutic Interventions: Skilled treatment session focused on speech and dysphagia goals. SLP facilitated session by providing Max A verbal and visual cues for use of an increased vocal intensity at the word and phrase level to achieve ~50% intelligibility. Patient also required total A to recall event from previous therapy session. Patient educated on pharyngeal strengthening exercises and performed with Mod A verbal cues for accuracy. Patient also consumed lunch meal of Dys. 1 textures with honey-thick liquids with Max-Total A multimodal cues needed for portion control and use of multiple swallows. Patient demonstrated overt cough X 1, suspect due to pharyngeal residue. Patient's brother present throughout session and provided appropriate encouragement and cueing. Patient left upright in wheelchair with family present. Continue with  current plan of care.   Function:  Eating Eating   Modified Consistency Diet: Yes Eating Assist Level: More than reasonable amount of time;Set up assist for;Supervision or verbal cues;Hand over hand assist   Eating Set Up Assist For: Opening containers Helper Scoops Food on Utensil: Occasionally Helper Brings Food to Mouth: Occasionally   Cognition Comprehension Comprehension assist level: Understands basic 25 - 49% of the time/ requires cueing 50 - 75% of the time  Expression   Expression assist level: Expresses basic 25 - 49% of the time/requires cueing 50 - 75% of the time. Uses single words/gestures.  Social Interaction Social Interaction assist level: Interacts appropriately 25 - 49% of time - Needs frequent redirection.  Problem Solving Problem solving assist level: Solves basic less than 25% of the time - needs direction nearly all the time or does not effectively solve problems and may need a restraint for safety  Memory Memory assist level: Recognizes or recalls 25 - 49% of the time/requires cueing 50 - 75% of the time    Pain Pain Assessment Pain Assessment: No/denies pain  Therapy/Group: Individual Therapy  Arless Vineyard 06/05/2016, 12:55 PM

## 2016-06-05 NOTE — Progress Notes (Signed)
Physical Therapy Note  Patient Details  Name: [name] [name] MRN: [id number] Date of Birth: 12/16/1990 Today's Date: 06/05/2016    Time: 1300-1400 60 minutes  1:1 Pt c/o pain in Lt LE, RN made aware and meds administered during session.  Bed mobility with max A. Attempt to have pt don his own shoes, pt initially refuses but with encouragement able to don Rt shoe, requires total A for Lt shoe and tying both shoes.  Transfers bed <> w/c with total A, increased time for motor planning and processing.  Gait with [name] walker with +2 assist, manual facilitation for wt shifts, assist for Lt LE placement as adductor tone kicks in with gait. Pt initially total A to step x 3 trials. Then able to gait 8', 10' with +2 assist and max encouragement.  Car transfer to simulated sedan height car. Pt's brother present and educated on importance of having a vehicle that is easy for transfer in/out of. Pt's brother states they have many vehicles within the family and they do have a sedan.  Pt +2 assist for car transfer.  W/c mobility with hemi technique with min A for steering.  Pt requires max encouragement but improves throughout session and with repetition of tasks.   [name] 06/05/2016, 1:59 PM

## 2016-06-05 NOTE — Progress Notes (Signed)
Occupational Therapy Session Note  Patient Details  Name: [name] [name] MRN: [id number] Date of Birth: 09/22/1990  Today's Date: 06/05/2016 OT Individual Time: 1000-1100 OT Individual Time Calculation (min): 60 min     Short Term Goals:Week 2:  OT Short Term Goal 1 (Week 2): max A of 1 for squat pivot to toilet with grab bar (BSC over toilet for height) OT Short Term Goal 2 (Week 2): max A of 1 to push into full stand for toileting/ LB dressing OT Short Term Goal 3 (Week 2): max A of 1 to maintain static standing balance while caregiver pulls pants over hips OT Short Term Goal 4 (Week 2): mod cues to fully attend to LUE during UB dressing  Skilled Therapeutic Interventions/Progress Updates:    Pt seen for ADL retraining with A from rehab tech to focus on attention, following directions, postural control, sit to stand, standing balance. Pt asking to use toilet, saying it was urgent.  Today (with all transfers) pt continued to need max A of 2 to facilitate forward lean and wt shift, pushing into stand using R hand to help, once in stand -facilitation through head and hips for upright posture of 2 helpers max A.  Pt taken to toilet (brief very soiled with liquid bowel), completed using toilet then transferred to roll in shower chair. From roll in shower chair, initiated self bathing what he could reach well, initiated shampooing hair. Transferred to EOB for dressing to challenge sitting balance and posture. Pt needed max facilitation from behind with therapists hands pulling shoulders into retraction for full sitting upright.  Good follow through with directions to don shirt(min) and pants(max).  Worked on sit to stand from elevated bed 3x for 20 sec each with max facilitation for full upright posture.   Transferred to w/c, with lap tray in place used tapping facilitation on biceps and triceps. Trace in triceps, but good improvement from yesterday with active elbow flexion of 50 degrees more rapidly  12x. Pt in room with family.  Therapy Documentation Precautions:  Precautions Precautions: Fall Precaution Comments: PEG Restrictions Weight Bearing Restrictions: No       Pain: Pain Assessment Pain Assessment: No/denies pain ADL: ADL ADL Comments: refer to functional navigator  See Function Navigator for Current Functional Status.   Therapy/Group: Individual Therapy  SAGUIER,JULIA 06/05/2016, 11:29 AM

## 2016-06-05 NOTE — Progress Notes (Signed)
Day 1 of 2: Calorie Count Note  48 hour calorie count ordered.  Diet: Dysphagia 1 diet with honey thick liquids  Breakfast: 325 kcal, 7 grams of protein Lunch: 175 kcal, 2 grams of protein Dinner: no information given 10/19  Total PO intake: 500 kcal (22% of minimum estimated needs)  9 grams of protein (8% of minimum estimated needs)  Estimated Nutritional Needs:  Kcal:  5009-3818 Protein:  110-130 grams Fluid:  2.3 - 2.5 L/day  Nutrition Dx:  Inadequate oral intake related to inability to eat as evidenced by NPO status; diet advanced; po 25% at lunch today.  Goal:  Pt to meet >/= 90% of their estimated nutrition needs; met via TF  Intervention:   Let patient attempt to eat at meals, if po intake <50% at that meal provide a bolus tube feed of Jevity 1.5 at volume of 350 ml up to three times daily via PEG. Provide HS bolus nightly.   Provide 30 ml Prostat BID per tube, each supplement provides 100 kcal and 15 grams of protein.   Continue free water flushes of 250 ml 4 times daily between boluses.  Encourage PO intake.   RD to follow up Monday (10/23) with final calorie count results.   Corrin Parker, MS, RD, LDN Pager # [phone] After hours/ weekend pager # [phone]

## 2016-06-06 ENCOUNTER — Inpatient Hospital Stay (HOSPITAL_COMMUNITY): Payer: Medicaid Other | Admitting: Occupational Therapy

## 2016-06-06 DIAGNOSIS — K592 Neurogenic bowel, not elsewhere classified: Secondary | ICD-10-CM

## 2016-06-06 DIAGNOSIS — R1312 Dysphagia, oropharyngeal phase: Secondary | ICD-10-CM

## 2016-06-06 DIAGNOSIS — M792 Neuralgia and neuritis, unspecified: Secondary | ICD-10-CM

## 2016-06-06 DIAGNOSIS — N319 Neuromuscular dysfunction of bladder, unspecified: Secondary | ICD-10-CM

## 2016-06-06 NOTE — Progress Notes (Signed)
[place] PHYSICAL MEDICINE & REHABILITATION     PROGRESS NOTE    Subjective/Complaints: Pt laying in bed this AM.  He is sleepy.  Per family, he does not need anything this AM, limited by language.   ROS limited by cognition/language to an extent  Objective: Vital Signs: Blood pressure 115/74, pulse 76, temperature 98.6 F (37 C), temperature source Oral, resp. rate 16, height 5\' 7"  (1.702 m), weight 74.7 kg (164 lb 10.9 oz), SpO2 100 %. No results found. No results for input(s): WBC, HGB, HCT, PLT in the last 72 hours.  Recent Labs  06/04/16 0632  NA 138  K 3.8  CL 98*  GLUCOSE 80  BUN 34*  CREATININE 1.34*  CALCIUM 9.6   CBG (last 3)  No results for input(s): GLUCAP in the last 72 hours.  Wt Readings from Last 3 Encounters:  06/06/16 74.7 kg (164 lb 10.9 oz)  05/25/16 78.2 kg (172 lb 8 oz)    Physical Exam:  Gen: Well developed. NAD.  HENT:Normocephalic. Abrasions.  Eyes: EOMI in right eye. No discharge.  Neck: Trach stoma closed Cardiovascular: Normal rateand regular rhythm.  Respiratory: Effort normaland breath sounds normal. Good air movement. No wheezes or rales.  GI: Soft. Bowel sounds are normal. PEG tube intact Neurological: dysconjugate gaze.  Tracks with right eye.  Follows simple one step commands.  Left hemiplegia mAS 1+/4 resting tone LUE, Left hamstrings/gastroc 2/4  Musculoskeletal: No edema. No tenderness. Psych: Unable to assess due to language.   Assessment/Plan: 1. Functional deficits secondary to TBI which require 3+ hours per day of interdisciplinary therapy in a comprehensive inpatient rehab setting. Physiatrist is providing close team supervision and 24 hour management of active medical problems listed below. Physiatrist and rehab team continue to assess barriers to discharge/monitor patient progress toward functional and medical goals.  Function:  Bathing Bathing position   Position: Shower (roll in shower chair)  Bathing  parts Body parts bathed by patient: Left arm, Chest, Front perineal area, Right upper leg, Left upper leg, Abdomen Body parts bathed by helper: Right arm, Buttocks, Right lower leg, Left lower leg, Back  Bathing assist Assist Level: 2 helpers      Upper Body Dressing/Undressing Upper body dressing   What is the patient wearing?: Pull over shirt/dress     Pull over shirt/dress - Perfomed by patient: Thread/unthread left sleeve, Put head through opening, Thread/unthread right sleeve Pull over shirt/dress - Perfomed by helper: Pull shirt over trunk        Upper body assist Assist Level:  (total assist)      Lower Body Dressing/Undressing Lower body dressing   What is the patient wearing?: Pants, Ted Hose, Shoes     Pants- Performed by patient: Thread/unthread right pants leg Pants- Performed by helper: Thread/unthread left pants leg, Pull pants up/down   Non-skid slipper socks- Performed by helper: Don/doff right sock, Don/doff left sock       Shoes - Performed by helper: Don/doff right shoe, Don/doff left shoe, Fasten right, Fasten left       TED Hose - Performed by helper: Don/doff right TED hose, Don/doff left TED hose  Lower body assist Assist for lower body dressing: 2 Helpers      Toileting Toileting     Toileting steps completed by helper: Adjust clothing prior to toileting, Performs perineal hygiene, Adjust clothing after toileting Toileting Assistive Devices: Grab bar or rail  Toileting assist Assist level: Two helpers   Transfers Chair/bed transfer  Chair/bed transfer method: Squat pivot Chair/bed transfer assist level: Total assist (Pt < 25%) Chair/bed transfer assistive device: Bedrails Mechanical lift: Air traffic controller Ambulation activity did not occur: Safety/medical concerns   Max distance: 8 Assist level: 2 helpers   Wheelchair   Type: Manual (TIS )   Assist Level: Dependent (Pt equals 0%)  Cognition Comprehension  Comprehension assist level: Understands basic 25 - 49% of the time/ requires cueing 50 - 75% of the time  Expression Expression assist level: Expresses basic 25 - 49% of the time/requires cueing 50 - 75% of the time. Uses single words/gestures.  Social Interaction Social Interaction assist level: Interacts appropriately 25 - 49% of time - Needs frequent redirection.  Problem Solving Problem solving assist level: Solves basic less than 25% of the time - needs direction nearly all the time or does not effectively solve problems and may need a restraint for safety  Memory Memory assist level: Recognizes or recalls 25 - 49% of the time/requires cueing 50 - 75% of the time   Medical Problem List and Plan: 1.  Weakness, left hemiparesthesia secondary to TBI 03/02/2016 complicated by sepsis and respiratory failure             -continue therapies---CIR PT, OT, speech  -have extended patient's stay given the progress he's showing 2.  DVT Prophylaxis/Anticoagulation: Subcutaneous Lovenox.  -vascular studies are normal 3. Pain Management: Tylenol as needed  -gabapentin increased to 300mg  for neuropathic left-sided pain 4. Tracheostomy: trach site healing  5. Neuropsych: This patient is not capable of making decisions on his own behalf. 6. Skin/Wound Care: Routine skin checks 7. Fluids/Electrolytes/Nutrition: labs improving 8. Dysphagia--now on D1/honeys  -added megace for appetite  -scheduled formula has been stopped  -continue with reduced flushes  -keep protein supp  -recheck BMET on Monday  9. ESBL Klebsiella in sputum culture. Contact precautions 10.  HCAP. Antibiotic therapy completed   Currently afebrile 11. Gout. 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 Neurogenic Bladder/bowel: continue voiding trial  -requiring intermittent caths at times/ otherwise incontinent  -remains incontinent of stool   LOS (Days) 12 A FACE TO FACE EVALUATION WAS  PERFORMED  Keana Dueitt Karis Juba 06/06/2016 11:48 AM

## 2016-06-06 NOTE — Progress Notes (Signed)
Occupational Therapy Session Note  Patient Details  Name: [name] [name] MRN: [id number] Date of Birth: 02/28/1991  Today's Date: 06/06/2016 OT Individual Time: 1000-1100 OT Individual Time Calculation (min): 60 min     Short Term Goals:Week 2:  OT Short Term Goal 1 (Week 2): max A of 1 for squat pivot to toilet with grab bar (BSC over toilet for height) OT Short Term Goal 2 (Week 2): max A of 1 to push into full stand for toileting/ LB dressing OT Short Term Goal 3 (Week 2): max A of 1 to maintain static standing balance while caregiver pulls pants over hips OT Short Term Goal 4 (Week 2): mod cues to fully attend to LUE during UB dressing  Skilled Therapeutic Interventions/Progress Updates:    Pt seen this session to facilitate functional mobility, trunk control, sit to stand, following directions, processing. -Pt received in bed, worked on rolling to R actively using core muscles to roll, actively kicking legs off bed and pushing into sitting with R hand with mod A. Max of 2 p. To complete sit pivot to w/c.   -Pt worked on grooming/ teeth brushing at sink with min cues.   -Taken to gym to work on sit to stand with goal of standing 20-30 sec. Pt sat on mat with parallel bars in front of pt to pull up on and mirror for visual feed back. Max of 2 to stand, once standing improved trunk extension and head lift but continued to need cues to keep head lifted. Pt would not tolerate more than 30 sec.  -Pt stated he had to toilet. Taken back to room, max of 2 for transfer and toileting. Pt was continent of bowel.   In room, pt in w/c with tray table positioned at L arm. Pt worked on active elbow flexion, finger flex with max cues and trace shoulder flexion observed with A/arom and tapping.  -total A with self feed with L hand with hand over hand A to bring cup to mouth with honey thick juice.   Lap tray in place for L arm. Pt in room with family.    Therapy Documentation Precautions:   Precautions Precautions: Fall Precaution Comments: PEG Restrictions Weight Bearing Restrictions: No   Vital Signs: Therapy Vitals Temp: 97.9 F (36.6 C) Temp Source: Oral Pulse Rate: 71 Resp: 17 BP: 129/78 Patient Position (if appropriate): Lying Oxygen Therapy SpO2: 100 % O2 Device: Not Delivered Pain:  no c/o pain ADL: ADL ADL Comments: refer to functional navigator  See Function Navigator for Current Functional Status.   Therapy/Group: Individual Therapy  Geno Sydnor 06/06/2016, 2:58 PM

## 2016-06-07 ENCOUNTER — Inpatient Hospital Stay (HOSPITAL_COMMUNITY): Payer: Medicaid Other | Admitting: Physical Therapy

## 2016-06-07 NOTE — Progress Notes (Signed)
Physical Therapy Session Note  Patient Details  Name: [name] [name] MRN: [id number] Date of Birth: 06/15/1991  Today's Date: 06/07/2016 PT Individual Time: 1400-1455 PT Individual Time Calculation (min): 55 min    Short Term Goals: Week 2:  PT Short Term Goal 1 (Week 2): Pt will consistently perform functional transfers with Max assist +1. PT Short Term Goal 2 (Week 2): Pt will perform car transfer with max assist + 1. PT Short Term Goal 3 (Week 2): Pt will consistently demonstrate sitting balance (dynamic & static) with supervision.   Skilled Therapeutic Interventions/Progress Updates:    Pt received in bed with brothers & professional interpreter present. Pt denied c/o pain. Pt rolled L<>R with bed rails & mod/max assist to allow therapist to don pants total assist. Pt required max multimodal cuing for sequencing to roll. Pt instructed in rolling supine>R sidelying with mod assist and sidelying>sitting EOB with max assist and max encouragement to initiate tasks. Pt reported need to use bathroom & performed lateral scoot/squat pivot bed>w/c with +2 total assist and max multimodal cuing for anterior weight shift, blocking L knee, and movement of buttocks with poor demo/participation by pt. PT noted pt to be incontinent of brief but pt wished to transfer to commode for hygiene care. Pt required +2 total assist for sit>stand with Stedy lift therefore pt was transferred back to bed with total assist for safety with hygiene tasks. Pt required min/mod assist to roll L<>R with bed rails with improved ability to sequence task. Pt rolled L/R multiple times to allow therapist to perform peri hygiene total assist and don new brief. Throughout session pt encouraged to engage and attempt tasks as much as possible; pt with poor demo. Educated pt's brothers on his ability to roll in bed & scoot to head of bed with min/mod assist which is why therapist continues to encourage pt to do as much as he can during sessions.  Pt's brother reports they are in the process of obtaining permit to build a ramp at home. At end of session pt left in bed with all needs within reach, alarm set & family & interpreter present.   Therapy Documentation Precautions:  Precautions Precautions: Fall Precaution Comments: PEG Restrictions Weight Bearing Restrictions: No   See Function Navigator for Current Functional Status.   Therapy/Group: Individual Therapy  Sandi MariscalVictoria M Mattheo Swindle 06/07/2016, 3:14 PM

## 2016-06-07 NOTE — Progress Notes (Signed)
[facility] PHYSICAL MEDICINE & REHABILITATION     PROGRESS NOTE    Subjective/Complaints: Pt laying in bed this AM. Brother at bedside.  He complains of left leg pain.   ROS: limited by cognition/language to an extent, appears to complain of left leg pain.  Objective: Vital Signs: Blood pressure 114/69, pulse 66, temperature 97.7 F (36.5 C), temperature source Oral, resp. rate 20, height 5\' 7"  (1.702 m), weight 73.3 kg (161 lb 9.6 oz), SpO2 100 %. No results found. No results for input(s): WBC, HGB, HCT, PLT in the last 72 hours. No results for input(s): NA, K, CL, GLUCOSE, BUN, CREATININE, CALCIUM in the last 72 hours.  Invalid input(s): CO CBG (last 3)  No results for input(s): GLUCAP in the last 72 hours.  Wt Readings from Last 3 Encounters:  06/07/16 73.3 kg (161 lb 9.6 oz)  05/25/16 78.2 kg (172 lb 8 oz)    Physical Exam:  Gen: Well developed. NAD.  HENT:Normocephalic. Abrasions.  Eyes: EOMI in right eye. No discharge.  Neck: Trach stoma closed Cardiovascular: Normal rateand regular rhythm.  Respiratory: Effort normaland breath sounds normal. Good air movement. No wheezes or rales.  GI: Soft. Bowel sounds are normal. PEG tube intact Neurological: dysconjugate gaze.  Tracks with right eye.  Follows simple one step commands.  Left hemiplegia mAS 1+/4 resting tone LUE, Left hamstrings/gastroc 2/4  Musculoskeletal: No edema. No tenderness. Psych: Unable to assess due to language.   Assessment/Plan: 1. Functional deficits secondary to TBI which require 3+ hours per day of interdisciplinary therapy in a comprehensive inpatient rehab setting. Physiatrist is providing close team supervision and 24 hour management of active medical problems listed below. Physiatrist and rehab team continue to assess barriers to discharge/monitor patient progress toward functional and medical goals.  Function:  Bathing Bathing position   Position: Shower (roll in shower chair)   Bathing parts Body parts bathed by patient: Left arm, Chest, Front perineal area, Right upper leg, Left upper leg, Abdomen Body parts bathed by helper: Right arm, Buttocks, Right lower leg, Left lower leg, Back  Bathing assist Assist Level: 2 helpers      Upper Body Dressing/Undressing Upper body dressing   What is the patient wearing?: Pull over shirt/dress     Pull over shirt/dress - Perfomed by patient: Thread/unthread left sleeve, Put head through opening, Thread/unthread right sleeve Pull over shirt/dress - Perfomed by helper: Pull shirt over trunk        Upper body assist Assist Level:  (total assist)      Lower Body Dressing/Undressing Lower body dressing   What is the patient wearing?: Pants, Ted Hose, Shoes     Pants- Performed by patient: Thread/unthread right pants leg Pants- Performed by helper: Thread/unthread left pants leg, Pull pants up/down   Non-skid slipper socks- Performed by helper: Don/doff right sock, Don/doff left sock       Shoes - Performed by helper: Don/doff right shoe, Don/doff left shoe, Fasten right, Fasten left       TED Hose - Performed by helper: Don/doff right TED hose, Don/doff left TED hose  Lower body assist Assist for lower body dressing: 2 Helpers      Toileting Toileting     Toileting steps completed by helper: Adjust clothing prior to toileting, Performs perineal hygiene, Adjust clothing after toileting Toileting Assistive Devices: Grab bar or rail  Toileting assist Assist level: Two helpers   Transfers Chair/bed transfer   Chair/bed transfer method: Squat pivot Chair/bed transfer assist  level: 2 helpers Chair/bed transfer assistive device: Bedrails Mechanical lift: Tour manager activity did not occur: Safety/medical concerns   Max distance: 8 Assist level: 2 helpers   Wheelchair   Type: Manual (TIS )   Assist Level: Dependent (Pt equals 0%)  Cognition Comprehension Comprehension  assist level: Understands basic 25 - 49% of the time/ requires cueing 50 - 75% of the time  Expression Expression assist level: Expresses basic 25 - 49% of the time/requires cueing 50 - 75% of the time. Uses single words/gestures.  Social Interaction Social Interaction assist level: Interacts appropriately 25 - 49% of time - Needs frequent redirection.  Problem Solving Problem solving assist level: Solves basic 25 - 49% of the time - needs direction more than half the time to initiate, plan or complete simple activities  Memory Memory assist level: Recognizes or recalls 25 - 49% of the time/requires cueing 50 - 75% of the time   Medical Problem List and Plan: 1.  Weakness, left hemiparesthesia secondary to TBI 03/02/2016 complicated by sepsis and respiratory failure             -continue therapies---CIR PT, OT, speech  -have extended patient's stay given the progress he's showing 2.  DVT Prophylaxis/Anticoagulation: Subcutaneous Lovenox.  -vascular studies are normal 3. Pain Management: Tylenol as needed  -gabapentin increased to 300mg  for neuropathic left-sided pain 4. Tracheostomy: trach site healing  5. Neuropsych: This patient is not capable of making decisions on his own behalf. 6. Skin/Wound Care: Routine skin checks 7. Fluids/Electrolytes/Nutrition: labs improving 8. Dysphagia--now on D1/honeys  -added megace for appetite  -scheduled formula has been stopped  -continue with reduced flushes  -keep protein supp  -recheck BMET on Monday  9. ESBL Klebsiella in sputum culture. Contact precautions 10.  HCAP. Antibiotic therapy completed   Currently afebrile 11. Gout. 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 Neurogenic Bladder/bowel:   -Cont I/O cath as needed  -remains incontinent of stool   LOS (Days) 13 A FACE TO FACE EVALUATION WAS PERFORMED  Allyana Vogan Karis Juba 06/07/2016 10:09 AM

## 2016-06-08 ENCOUNTER — Inpatient Hospital Stay (HOSPITAL_COMMUNITY): Payer: Medicaid Other | Admitting: Physical Therapy

## 2016-06-08 ENCOUNTER — Inpatient Hospital Stay (HOSPITAL_COMMUNITY): Payer: Medicaid Other | Admitting: Occupational Therapy

## 2016-06-08 ENCOUNTER — Inpatient Hospital Stay (HOSPITAL_COMMUNITY): Payer: Medicaid Other | Admitting: Speech Pathology

## 2016-06-08 DIAGNOSIS — N179 Acute kidney failure, unspecified: Secondary | ICD-10-CM

## 2016-06-08 LAB — BASIC METABOLIC PANEL
ANION GAP: 13 (ref 5–15)
BUN: 25 mg/dL — ABNORMAL HIGH (ref 6–20)
CHLORIDE: 103 mmol/L (ref 101–111)
CO2: 23 mmol/L (ref 22–32)
Calcium: 9.4 mg/dL (ref 8.9–10.3)
Creatinine, Ser: 1.44 mg/dL — ABNORMAL HIGH (ref 0.61–1.24)
GFR calc non Af Amer: >60 mL/min (ref >60)
GLUCOSE: 123 mg/dL — AB (ref 65–99)
POTASSIUM: 4 mmol/L (ref 3.5–5.1)
Sodium: 139 mmol/L (ref 135–145)

## 2016-06-08 LAB — URINALYSIS, ROUTINE W REFLEX MICROSCOPIC
Bilirubin Urine: NEGATIVE
Glucose, UA: NEGATIVE mg/dL
Hgb urine dipstick: NEGATIVE
KETONES UR: NEGATIVE mg/dL
LEUKOCYTES UA: NEGATIVE
NITRITE: NEGATIVE
PH: 8 (ref 5.0–8.0)
Protein, ur: 100 mg/dL — AB
SPECIFIC GRAVITY, URINE: 1.008 (ref 1.005–1.030)

## 2016-06-08 LAB — URINE MICROSCOPIC-ADD ON: RBC / HPF: NONE SEEN RBC/hpf (ref 0–5)

## 2016-06-08 MED ORDER — JEVITY 1.2 CAL PO LIQD
ORAL | Status: AC
  Filled 2016-06-08: qty 237

## 2016-06-08 MED ORDER — JEVITY 1.5 CAL/FIBER PO LIQD
350.0000 mL | Freq: Three times a day (TID) | ORAL | Status: DC | PRN
  Filled 2016-06-08 (×3): qty 1000

## 2016-06-08 MED ORDER — GABAPENTIN 300 MG PO CAPS
300.0000 mg | ORAL_CAPSULE | Freq: Two times a day (BID) | ORAL | Status: DC
  Administered 2016-06-08 – 2016-06-12 (×9): 300 mg via ORAL
  Filled 2016-06-08 (×9): qty 1

## 2016-06-08 MED ORDER — COLCHICINE 0.6 MG PO TABS
0.6000 mg | ORAL_TABLET | Freq: Every day | ORAL | Status: DC
  Administered 2016-06-09 – 2016-06-11 (×3): 0.6 mg
  Filled 2016-06-08 (×3): qty 1

## 2016-06-08 MED ORDER — JEVITY 1.5 CAL/FIBER PO LIQD: 350.0000 mL | Freq: Three times a day (TID) | ORAL | Status: DC

## 2016-06-08 NOTE — Progress Notes (Signed)
Speech Language Pathology Daily Session Note  Patient Details  Name: [name] [name] MRN: [id number] Date of Birth: 08/07/1991  Today's Date: 06/08/2016 SLP Individual Time: 1000-1045 SLP Individual Time Calculation (min): 45 min   Short Term Goals: Week 2: SLP Short Term Goal 1 (Week 2): Patient will consume current diet with minimal overt s/s of aspiration with Mod A verbal and question cues.  SLP Short Term Goal 2 (Week 2): Patient will verbally answer questions in 75% of opportunties with Mod A verbal cues.  SLP Short Term Goal 3 (Week 2): Patient will utilize speech intelligibility strategies at the phrase level with Mod A verbal cues to achieve 90% intelligibility. SLP Short Term Goal 4 (Week 2): Patient will demonstrate sustained attention to task for ~30 minutes with Min A verbal cues for redirection.  SLP Short Term Goal 5 (Week 2): Patient will demonstrate functional problem solving with basic and familiar tasks with Mod A verbal and visual cues.  SLP Short Term Goal 6 (Week 2): Patient will orient to time, place and situation with Min A verbal cues.   Skilled Therapeutic Interventions: Skilled therapy intervention focused on cognitive goals. Patient expressed that he had not slept well the night before and was tired today, which likely affected attention during session. Patient required Mod A verbal cues for initiation at the start of session, increasing to Max A verbal cues by the end of session. Patient demonstrated selective attention to problem solving task for about 10 minutes given Mod A verbal cues. Patient was able to demonstrate problem solving in a pictorial card task with 80% accuracy given Mod A verbal and visual cues. Given Min A verbal cues throughout session, patient increased vocal intensity for a resulting 75% intelligibility at the phrase level. Patient recalled swallowing strategy with supervision verbal cue, but required Total A verbal cues for recall of 3 pharyngeal  strengthening exercises addressed in previous SLP therapy session. Patient left upright in bed with family and translator in the room. Continue current plan of care.     Function:  Cognition Comprehension Comprehension assist level: Understands basic 25 - 49% of the time/ requires cueing 50 - 75% of the time  Expression   Expression assist level: Expresses basic 25 - 49% of the time/requires cueing 50 - 75% of the time. Uses single words/gestures.  Social Interaction Social Interaction assist level: Interacts appropriately 50 - 74% of the time - May be physically or verbally inappropriate.  Problem Solving Problem solving assist level: Solves basic 25 - 49% of the time - needs direction more than half the time to initiate, plan or complete simple activities  Memory Memory assist level: Recognizes or recalls less than 25% of the time/requires cueing greater than 75% of the time    Pain Pain Assessment Pain Type: Acute pain Pain Location: Leg Pain Orientation: Left Pain Intervention(s): Medication (See eMAR)  Therapy/Group: Individual Therapy  Caryn Sectionlison Alannah Averhart 06/08/2016, 3:22 PM

## 2016-06-08 NOTE — Progress Notes (Signed)
Physical Therapy Session Note  Patient Details  Name: [name] CraneOmar Grills MRN: [id number] Date of Birth: 10/22/1990  Today's Date: 06/08/2016 PT Individual Time: [id number] PT Individual Time Calculation (min): 75 min    Short Term Goals: Week 2:  PT Short Term Goal 1 (Week 2): Pt will consistently perform functional transfers with Max assist +1. PT Short Term Goal 2 (Week 2): Pt will perform car transfer with max assist + 1. PT Short Term Goal 3 (Week 2): Pt will consistently demonstrate sitting balance (dynamic & static) with supervision.   Skilled Therapeutic Interventions/Progress Updates:    Pt received in w/c & agreeable to PT. During session pt noted 5/10 pain in LLE & RN made aware. Pt transferred w/c>tilt table via maxi move+2 assist for safety. Pt required multimodal cuing for anterior weight shift for sling placement. Pt tolerated standing with tilt table x 10 minutes for increased weight bearing through BLE. Pt c/o pain in L knee with full extension. Educated pt on mini squats but pt only able to actively flex/extend R knee. Pt's mother & sister present for session. Educated pt's family on need for simple commands/sentences, one person talking at a time, and significant time for pt to process commands and perform task. Pt reported need to use restroom & returned to w/c via maxi move. Transported pt back to room via w/c total assist & pt reported incontinence of bowels. Due to increase in lethargy pt transferred w/c>bed via maxi move. Pt required max/total assist and max multimodal cuing for rolling L/R with use of bed rails for total assist peri-hygiene. Pt required max cuing to keep eyes open and to verbalize thoughts throughout session as pt with limited conversation. Pt's mother reports pt has not been talking as much today compared to yesterday. Therapist performed LLE PROM stretching to knee, hip, and heel cord; pt limited by significant pain in knee and hip joints. Donned PRAFO boot and pt  left in bed with mother, sister, & interpreter present, bed alarm set & all needs within reach.   Pt missed 15 minutes of skilled PT treatment 2/2 lethargy/fatigue. RN notified of pt's fatigue, limited talking, and more time required to follow commands/participate in tasks.  Professional interpreter present for session.   Therapy Documentation Precautions:  Precautions Precautions: Fall Precaution Comments: PEG Restrictions Weight Bearing Restrictions: No   General: PT Amount of Missed Time (min): 15 Minutes PT Missed Treatment Reason: Other (Comment) (fatigue)   See Function Navigator for Current Functional Status.   Therapy/Group: Individual Therapy  Sandi MariscalVictoria M Jhania Etherington 06/08/2016, 2:22 PM

## 2016-06-08 NOTE — Progress Notes (Signed)
Occupational Therapy Session Note  Patient Details  Name: [name] [name] MRN: [id number] Date of Birth: 04/22/1991  Today's Date: 06/08/2016 OT Individual Time: 1045-1200 OT Individual Time Calculation (min): 75 min   Short Term Goals:Week 2:  OT Short Term Goal 1 (Week 2): max A of 1 for squat pivot to toilet with grab bar (BSC over toilet for height) OT Short Term Goal 2 (Week 2): max A of 1 to push into full stand for toileting/ LB dressing OT Short Term Goal 3 (Week 2): max A of 1 to maintain static standing balance while caregiver pulls pants over hips OT Short Term Goal 4 (Week 2): mod cues to fully attend to LUE during UB dressing  Skilled Therapeutic Interventions/Progress Updates:    Nursing and therapy reported to this therapist that pt had been very lethargic and not participating as well on Sunday and this morning.  This therapist did not want to try a shower or bathing in w/c today until pt could be more alert. Pt did bathe in bed with mod-max A (less than he usually does), pants donned in bed total A. Pt sat to EOB pushing up with R arm with min A which is the most he has ever done. Once sitting, pt had great difficulty not leaning to the R. Towel wedge placed under R hip. Pt was then able to hold static posture with close S to occasional steadying A.  Donned shirt with improved participation and attention of mod A.   Explained to family and pt that we would try a slide board today as his transfers have not been improving.  Pt used board to move to L with very little A as pt used R hand to push his hips over. Pt taken to mat in gym and needed significantly more A to go to the R. Once on mat, worked on sitting balance with wedge under R hip.  A/arom to LUE to grasp and release cones with reaching forward with 90% A.  LUE in gravity eliminated on tray table for active elbow flexion exercises.  Transferred back to w/c to L on board with min-mod A. Pt constantly crosses his strong R foot over  L and tries to maintain it in midline. Attempted to brace foot in position with therapist's foot and place block between feet but pt constantly uses R foot to kick it out of the way.  The lack of support from R foot decreases sitting stability and severely inhibits successful transfers. Pt taken back to room by his family.   Therapy Documentation Precautions:  Precautions Precautions: Fall Precaution Comments: PEG Restrictions Weight Bearing Restrictions: No    Vital Signs: Therapy Vitals Temp: 98.4 F (36.9 C) Temp Source: Oral Pulse Rate: 85 Resp: 18 BP: 124/67 Patient Position (if appropriate): Lying Oxygen Therapy SpO2: 100 % O2 Device: Not Delivered   Pain: c/o L knee pain, ace wrapped knee   ADL: ADL ADL Comments: refer to functional navigator  See Function Navigator for Current Functional Status.   Therapy/Group: Individual Therapy  [name] [name] 06/08/2016, 8:46 AM

## 2016-06-08 NOTE — Progress Notes (Signed)
Day 2 of 2: Calorie Count Note   48 hour calorie count ordered.  Diet: Dysphagia 1 diet with honey thick liquids  Breakfast: 786 kcal, 20 grams of protein Lunch: no information given Dinner: 675 kcal, 29 grams of protein  Total PO intake: 1461 kcal (65% of minimum estimated needs)  49 grams of protein (45% of minimum estimated needs)  Estimated Nutritional Needs:  Kcal:  8250-5397  Protein:  110-130 grams Fluid:  2.3 - 2.5 L/day  Intake has improved at meals. RD to discontinue HS boluses, however will continue with prostat per tube to until diet advances to aid in adequate protein needs.   Nutrition Dx:  Inadequate oral intake related to inability to eat as evidenced by NPO status; diet advanced; po varied from 25-100%; improving  Goal:  Pt to meet >/= 90% of their estimated nutrition needs; met via TF  Intervention:   Let patient attempt to eat at meals, if po intake <50% at that meal provide a bolus tube feed of Jevity 1.5 at volume of 350 ml up to three times daily via PEG.   Discontinue HS bolus nightly.   Provide 30 ml Prostat BID per tube, each supplement provides 100 kcal and 15 grams of protein.   Continue free water flushes of 250 ml 4 times daily between boluses.  Encourage PO intake.   Corrin Parker, MS, RD, LDN Pager # [phone] After hours/ weekend pager # [phone]

## 2016-06-08 NOTE — Progress Notes (Signed)
[place] [facility] PHYSICAL MEDICINE & REHABILITATION     PROGRESS NOTE    Subjective/Complaints: Pt sitting up in bed eating breakfast without difficulty.    ROS: limited by cognition/language to an extent  Objective: Vital Signs: Blood pressure 124/67, pulse 85, temperature 98.4 F (36.9 C), temperature source Oral, resp. rate 18, height 5\' 7"  (1.702 m), weight 77.5 kg (170 lb 13.7 oz), SpO2 100 %. No results found. No results for input(s): WBC, HGB, HCT, PLT in the last 72 hours.  Recent Labs  06/08/16 0835  NA 139  K 4.0  CL 103  GLUCOSE 123*  BUN 25*  CREATININE 1.44*  CALCIUM 9.4   CBG (last 3)  No results for input(s): GLUCAP in the last 72 hours.  Wt Readings from Last 3 Encounters:  06/08/16 77.5 kg (170 lb 13.7 oz)  05/25/16 78.2 kg (172 lb 8 oz)    Physical Exam:  Gen: Well developed. NAD.  HENT: Normocephalic. Abrasions.  Eyes: EOMI in right eye. No discharge.  Neck: Trach stoma closed Cardiovascular: Normal rateand regular rhythm.  Respiratory: Effort normaland breath sounds normal. Good air movement. No wheezes or rales.  GI: Soft. Bowel sounds are normal. PEG tube intact Neurological: Dysconjugate gaze.  Tracks with right eye.  Follows simple one step commands.  Left hemiplegia mAS 1+/4 resting tone LUE, Left hamstrings/gastroc 2/4  Musculoskeletal: No edema. No tenderness. Psych: Unable to assess due to language.   Assessment/Plan: 1. Functional deficits secondary to TBI which require 3+ hours per day of interdisciplinary therapy in a comprehensive inpatient rehab setting. Physiatrist is providing close team supervision and 24 hour management of active medical problems listed below. Physiatrist and rehab team continue to assess barriers to discharge/monitor patient progress toward functional and medical goals.  Function:  Bathing Bathing position   Position: Shower (roll in shower chair)  Bathing parts Body parts bathed by patient: Left arm,  Chest, Front perineal area, Right upper leg, Left upper leg, Abdomen Body parts bathed by helper: Right arm, Buttocks, Right lower leg, Left lower leg, Back  Bathing assist Assist Level: 2 helpers      Upper Body Dressing/Undressing Upper body dressing   What is the patient wearing?: Pull over shirt/dress     Pull over shirt/dress - Perfomed by patient: Thread/unthread left sleeve, Put head through opening, Thread/unthread right sleeve Pull over shirt/dress - Perfomed by helper: Pull shirt over trunk        Upper body assist Assist Level:  (total assist)      Lower Body Dressing/Undressing Lower body dressing   What is the patient wearing?: Pants, Ted Hose, Shoes     Pants- Performed by patient: Thread/unthread right pants leg Pants- Performed by helper: Thread/unthread left pants leg, Pull pants up/down   Non-skid slipper socks- Performed by helper: Don/doff right sock, Don/doff left sock       Shoes - Performed by helper: Don/doff right shoe, Don/doff left shoe, Fasten right, Fasten left       TED Hose - Performed by helper: Don/doff right TED hose, Don/doff left TED hose  Lower body assist Assist for lower body dressing: 2 Helpers      Toileting Toileting     Toileting steps completed by helper: Adjust clothing prior to toileting, Performs perineal hygiene, Adjust clothing after toileting Toileting Assistive Devices: Grab bar or rail  Toileting assist Assist level: Two helpers   Transfers Chair/bed transfer   Chair/bed transfer method: Squat pivot Chair/bed transfer assist level: 2 helpers  Chair/bed transfer assistive device: Bedrails Mechanical lift: Tour managerMaxisky   Locomotion Ambulation Ambulation activity did not occur: Safety/medical concerns   Max distance: 8 Assist level: 2 helpers   Wheelchair   Type: Manual (TIS )   Assist Level: Dependent (Pt equals 0%)  Cognition Comprehension Comprehension assist level: Understands basic 25 - 49% of the time/  requires cueing 50 - 75% of the time  Expression Expression assist level: Expresses basic 25 - 49% of the time/requires cueing 50 - 75% of the time. Uses single words/gestures.  Social Interaction Social Interaction assist level: Interacts appropriately 25 - 49% of time - Needs frequent redirection.  Problem Solving Problem solving assist level: Solves basic 25 - 49% of the time - needs direction more than half the time to initiate, plan or complete simple activities  Memory Memory assist level: Recognizes or recalls 25 - 49% of the time/requires cueing 50 - 75% of the time   Medical Problem List and Plan: 1.  Weakness, left hemiparesthesia secondary to TBI 03/02/2016 complicated by sepsis and respiratory failure             -continue therapies---CIR PT, OT, speech  -have extended patient's stay given the progress he's showing 2.  DVT Prophylaxis/Anticoagulation: Subcutaneous Lovenox.  -vascular studies are normal 3. Pain Management: Tylenol as needed  -gabapentin increased to 300mg  for neuropathic left-sided pain 4. Tracheostomy: trach site healing  5. Neuropsych: This patient is not capable of making decisions on his own behalf. 6. Skin/Wound Care: Routine skin checks 7. Fluids/Electrolytes/Nutrition: labs improving 8. Dysphagia--now on D1/honeys  -added megace for appetite  -scheduled formula has been stopped  -continue with reduced flushes  -keep protein supp 9. ESBL Klebsiella in sputum culture. Contact precautions 10.  HCAP. Antibiotic therapy completed   Currently afebrile 11. Gout. 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 Neurogenic Bladder/bowel:   -Cont I/O cath as needed  -remains incontinent of stool 14. AKI  Cr 1.44 on 10/23, trending up, BUN improved  Colchicine dose reduced  Will cont to monitor   LOS (Days) 14 A FACE TO FACE EVALUATION WAS PERFORMED  Zayd Bonet Karis Jubanil Jaid Quirion 06/08/2016 9:44 AM

## 2016-06-09 ENCOUNTER — Inpatient Hospital Stay (HOSPITAL_COMMUNITY): Payer: Medicaid Other | Admitting: Occupational Therapy

## 2016-06-09 ENCOUNTER — Inpatient Hospital Stay (HOSPITAL_COMMUNITY): Payer: Medicaid Other

## 2016-06-09 ENCOUNTER — Inpatient Hospital Stay (HOSPITAL_COMMUNITY): Payer: Self-pay | Admitting: Physical Therapy

## 2016-06-09 ENCOUNTER — Inpatient Hospital Stay (HOSPITAL_COMMUNITY): Payer: Medicaid Other | Admitting: Speech Pathology

## 2016-06-09 DIAGNOSIS — R5383 Other fatigue: Secondary | ICD-10-CM | POA: Insufficient documentation

## 2016-06-09 NOTE — Progress Notes (Signed)
Physical Therapy Weekly Progress Note  Patient Details  Name: [name] [name] MRN: [id number] Date of Birth: October 27, 1990  Beginning of progress report period: June 02, 2016 End of progress report period: June 09, 2016  Today's Date: 06/09/2016 PT Individual Time: 4098-1191 PT Individual Time Calculation (min): 71 min    Patient has met 1 of 3 short term goals.  Patient is progressing toward goals and inconsistently is able to meet goal target.   Patient continues to demonstrate the following deficits: decreased arousal, cardiovascular endurance, motor control and coordination, impaired cognition, decreased overall strength and therefore will continue to benefit from skilled PT intervention to enhance overall performance with activity tolerance, balance, postural control, ability to compensate for deficits, functional use of  left upper extremity and left lower extremity, attention, awareness and coordination.  Patient progressing toward long term goals..  Continue plan of care.  PT Short Term Goals Week 3:  PT Short Term Goal 1 (Week 3): Patient will consistently perform functional transfers with moderate assistance  PT Short Term Goal 2 (Week 3): Patient will consistenly follow one step instructions 100% of the time.  PT Short Term Goal 3 (Week 3): Patient will propel wheelchair 25 feet with max assist.  PT Short Term Goal 4 (Week 3): Patient will stand with least restrictive assistive device for 90 seconds with max assistx1.    Skilled Therapeutic Interventions/Progress Updates:     Therapy Documentation Precautions:  Precautions Precautions: Fall Precaution Comments: PEG Restrictions Weight Bearing Restrictions: No   Pain: Pain Assessment Pain Assessment: No/denies pain   Patient received supine in bed. Patient performed rolling to the right with moderate assistance. Sidelying to short sit edge of bed max assist with use of bed rail.   Max assist slide board transfer  from bed to wheelchair.   Patient performed sit to and from stand transfer with and without ewa walker max assist.  Patient stood three total trials two person assist one max the other min to mod. Patient stood between 45 and 75 seconds without assistive device . Emphasis on upright posture, positioning in midline. Manual facilitation for hip extension and trunk extension.  Patient stood two person assist with Shelle Iron for 60 seconds ne max the other min to mod. Patient demonstrated improved posture and hip extension initiation.   Patient request to use the bathroom. Patient continent of bowel and bladder. Patient max assist squat pivot from wheelchair to commode. Patient total assist for clothing management. Two person assist one for clothing management and another providing max assist for standing with patient utilizing grab bar for support. Patient required total assist for peri care and hygiene. Patient stood with max assist and use of grab bar three times with max assist and total assist for clothing management and peri care performed.    PNF D1 flexion extension performed with LUE.   Patient left in wheelchair at end of session with all needs met and quick release belt engaged. Family present in room at end of session.   See Function Navigator for Current Functional Status.  Therapy/Group: Individual Therapy  Retta Diones 06/09/2016, 4:09 PM

## 2016-06-09 NOTE — Progress Notes (Signed)
Speech Language Pathology Daily Session Note  Patient Details  Name: [name] [name] MRN: [id number] Date of Birth: 03/30/1991  Today's Date: 06/09/2016 SLP Individual Time: 1100-1200 SLP Individual Time Calculation (min): 60 min   Short Term Goals: Week 2: SLP Short Term Goal 1 (Week 2): Patient will consume current diet with minimal overt s/s of aspiration with Mod A verbal and question cues.  SLP Short Term Goal 2 (Week 2): Patient will verbally answer questions in 75% of opportunties with Mod A verbal cues.  SLP Short Term Goal 3 (Week 2): Patient will utilize speech intelligibility strategies at the phrase level with Mod A verbal cues to achieve 90% intelligibility. SLP Short Term Goal 4 (Week 2): Patient will demonstrate sustained attention to task for ~30 minutes with Min A verbal cues for redirection.  SLP Short Term Goal 5 (Week 2): Patient will demonstrate functional problem solving with basic and familiar tasks with Mod A verbal and visual cues.  SLP Short Term Goal 6 (Week 2): Patient will orient to time, place and situation with Min A verbal cues.   Skilled Therapeutic Interventions:Skilled therapy intervention focused on cognitive and dysphagia goals. Upon arrival to session, patient being fed by family member while reclined in bed. Family re-educated on the importance of 90 degree seated position, implementation of swallowing strategies, and full staff supervision while self-feeding. Family indicated understanding via translator and no questions at this time. Patient required Total A multimodal cues for rate, portion control, and use of multiple swallows for duration of self-feeding. Patient exhibited no overt s/s of aspiration, however patient with noted increase in work of breathing; [facility] Patient required Mod A verbal cues for selective attention during a daily, functional problem-solving task. Patient left upright in bed with family and transport in the  room to take patient to x-ray. Continue current plan of care.   Function:  Eating Eating   Modified Consistency Diet: Yes Eating Assist Level: Supervision or verbal cues     Helper Scoops Food on Utensil: Occasionally Helper Brings Food to Mouth: Occasionally   Cognition Comprehension Comprehension assist level: Understands basic 25 - 49% of the time/ requires cueing 50 - 75% of the time  Expression   Expression assist level: Expresses basis less than 25% of the time/requires cueing >75% of the time.  Social Interaction Social Interaction assist level: Interacts appropriately 25 - 49% of time - Needs frequent redirection.  Problem Solving Problem solving assist level: Solves basic 25 - 49% of the time - needs direction more than half the time to initiate, plan or complete simple activities  Memory Memory assist level: Recognizes or recalls 25 - 49% of the time/requires cueing 50 - 75% of the time    Pain Pain Assessment Pain Assessment: No/denies pain  Therapy/Group: Individual Therapy  [name] [name] 06/09/2016, 3:53 PM

## 2016-06-09 NOTE — Progress Notes (Signed)
[place] PHYSICAL MEDICINE & REHABILITATION     PROGRESS NOTE    Subjective/Complaints: Pt laying in bed this AM.  He is sleepy.  No English speaking family available.  Nods head "no" to pain questions.    ROS: limited by cognition/language to an extent  Objective: Vital Signs: Blood pressure 129/71, pulse 73, temperature 98.2 F (36.8 C), temperature source Oral, resp. rate 18, height 5\' 7"  (1.702 m), weight 76 kg (167 lb 8.8 oz), SpO2 100 %. No results found. No results for input(s): WBC, HGB, HCT, PLT in the last 72 hours.  Recent Labs  06/08/16 0835  NA 139  K 4.0  CL 103  GLUCOSE 123*  BUN 25*  CREATININE 1.44*  CALCIUM 9.4   CBG (last 3)  No results for input(s): GLUCAP in the last 72 hours.  Wt Readings from Last 3 Encounters:  06/09/16 76 kg (167 lb 8.8 oz)  05/25/16 78.2 kg (172 lb 8 oz)    Physical Exam:  Gen: Well developed. NAD.  HENT: Normocephalic. Abrasions.  Eyes: EOMI in right eye. No discharge.  Neck: Trach stoma closed Cardiovascular: Normal rateand regular rhythm.  Respiratory: Effort normaland breath sounds normal. Good air movement. No wheezes or rales.  GI: Soft. Bowel sounds are normal. PEG tube intact Neurological: Dysconjugate gaze.  Tracks with right eye.  Follows simple one step commands.  Left hemiplegia mAS 1+/4 resting tone LUE, Left hamstrings/gastroc 2/4  Musculoskeletal: No edema. No tenderness. Psych: Unable to assess due to language.   Assessment/Plan: 1. Functional deficits secondary to TBI which require 3+ hours per day of interdisciplinary therapy in a comprehensive inpatient rehab setting. Physiatrist is providing close team supervision and 24 hour management of active medical problems listed below. Physiatrist and rehab team continue to assess barriers to discharge/monitor patient progress toward functional and medical goals.  Function:  Bathing Bathing position   Position: Bed  Bathing parts Body parts  bathed by patient: Left arm, Chest, Right upper leg, Abdomen, Front perineal area Body parts bathed by helper: Right arm, Buttocks, Left upper leg, Right lower leg, Left lower leg, Back  Bathing assist Assist Level: 2 helpers      Upper Body Dressing/Undressing Upper body dressing   What is the patient wearing?: Pull over shirt/dress     Pull over shirt/dress - Perfomed by patient: Thread/unthread right sleeve, Put head through opening Pull over shirt/dress - Perfomed by helper: Pull shirt over trunk        Upper body assist Assist Level:  (total assist)      Lower Body Dressing/Undressing Lower body dressing   What is the patient wearing?: Pants, Non-skid slipper socks, Ted Hose     Pants- Performed by patient: Thread/unthread right pants leg Pants- Performed by helper: Thread/unthread left pants leg, Pull pants up/down   Non-skid slipper socks- Performed by helper: Don/doff right sock, Don/doff left sock       Shoes - Performed by helper: Don/doff right shoe, Don/doff left shoe, Fasten right, Fasten left       TED Hose - Performed by helper: Don/doff right TED hose, Don/doff left TED hose  Lower body assist Assist for lower body dressing: 2 Helpers      Toileting Toileting     Toileting steps completed by helper: Adjust clothing prior to toileting, Performs perineal hygiene, Adjust clothing after toileting Toileting Assistive Devices: Grab bar or rail  Toileting assist Assist level: Two helpers   Transfers Chair/bed transfer   Chair/bed transfer  method: Lateral scoot Chair/bed transfer assist level: dependent (Pt equals 0%) Chair/bed transfer assistive device: Sliding board Mechanical lift: Maximove   Locomotion Ambulation Ambulation activity did not occur: Safety/medical concerns   Max distance: 8 Assist level: 2 helpers   Wheelchair   Type: Manual (TIS )   Assist Level: Dependent (Pt equals 0%)  Cognition Comprehension Comprehension assist level:  Understands basic 25 - 49% of the time/ requires cueing 50 - 75% of the time  Expression Expression assist level: Expresses basic 25 - 49% of the time/requires cueing 50 - 75% of the time. Uses single words/gestures.  Social Interaction Social Interaction assist level: Interacts appropriately 25 - 49% of time - Needs frequent redirection.  Problem Solving Problem solving assist level: Solves basic 25 - 49% of the time - needs direction more than half the time to initiate, plan or complete simple activities  Memory Memory assist level: Recognizes or recalls less than 25% of the time/requires cueing greater than 75% of the time   Medical Problem List and Plan: 1.  Weakness, left hemiparesthesia secondary to TBI 03/02/2016 complicated by sepsis and respiratory failure             -continue therapies---CIR PT, OT, speech  -have extended patient's stay given the progress he's showing 2.  DVT Prophylaxis/Anticoagulation: Subcutaneous Lovenox.  -vascular studies are normal 3. Pain Management: Tylenol as needed  -gabapentin increased to 300mg  for neuropathic left-sided pain, reduced back to BID due to concerns of lethargy 4. Tracheostomy: trach site healing  5. Neuropsych: This patient is not capable of making decisions on his own behalf. 6. Skin/Wound Care: Routine skin checks 7. Fluids/Electrolytes/Nutrition: labs improving 8. Dysphagia--now on D1/honeys  -added megace for appetite  -scheduled formula has been stopped  -continue with reduced flushes  -keep protein supp 9. ESBL Klebsiella in sputum culture. Contact precautions 10.  HCAP. Antibiotic therapy completed   Currently afebrile 11. Gout. 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 Neurogenic Bladder/bowel:   -Cont I/O cath as needed  -remains incontinent of stool 14. AKI  Cr 1.44 on 10/23, trending up, BUN improved  Colchicine dose reduced  Will cont to monitor 15. Lethargy  CXR  ordered as diet was advanced  Ua/Ucx ordered  Labs ordered  Pain meds reduced   LOS (Days) 15 A FACE TO FACE EVALUATION WAS PERFORMED  Jadin Creque Karis Jubanil Kaitrin Seybold 06/09/2016 11:26 AM

## 2016-06-09 NOTE — Progress Notes (Signed)
Occupational Therapy Session Note  Patient Details  Name: [name] CraneOmar Munger MRN: [id number] Date of Birth: 11/06/1990  Today's Date: 06/09/2016 OT Individual Time: 1000-1059 OT Individual Time Calculation (min): 59 min     Short Term Goals: Week 2:  OT Short Term Goal 1 (Week 2): max A of 1 for squat pivot to toilet with grab bar (BSC over toilet for height) OT Short Term Goal 2 (Week 2): max A of 1 to push into full stand for toileting/ LB dressing OT Short Term Goal 3 (Week 2): max A of 1 to maintain static standing balance while caregiver pulls pants over hips OT Short Term Goal 4 (Week 2): mod cues to fully attend to LUE during UB dressing  Skilled Therapeutic Interventions/Progress Updates:    Upon entering the room, pt in bed with HOB elevated and sister feeding pt. OT provided education to family regarding importance of pt attempting to perform these tasks himself in order to increase independence with tasks. Pt declined OOB activities these session but agreeable to OT intervention from bed level. Pt needing maximal encouragement and OT placing wash cloth in hand with occasional hand over hand in order to initiate task. Pt needing +2 assist for bed mobility and self care tasks this session with max verbal cues for hand placement, technique, and sequence. Pt with extreme itching of back and OT placed medicated lotion and powder on these areas for relief. Pt remained in bed at end of session with family remaining in room. Call bell and all needed items within reach.  Therapy Documentation Precautions:  Precautions Precautions: Fall Precaution Comments: PEG Restrictions Weight Bearing Restrictions: No General:   Vital Signs: Therapy Vitals Pulse Rate: 73 BP: 129/71 Pain:   ADL: ADL ADL Comments: refer to functional navigator Exercises:   Other Treatments:    See Function Navigator for Current Functional Status.   Therapy/Group: Individual Therapy  Lowella Gripittman, Deissy Guilbert  L 06/09/2016, 11:16 AM

## 2016-06-10 ENCOUNTER — Inpatient Hospital Stay (HOSPITAL_COMMUNITY): Payer: Medicaid Other | Admitting: Speech Pathology

## 2016-06-10 ENCOUNTER — Inpatient Hospital Stay (HOSPITAL_COMMUNITY): Payer: Self-pay | Admitting: *Deleted

## 2016-06-10 ENCOUNTER — Inpatient Hospital Stay (HOSPITAL_COMMUNITY): Payer: Medicaid Other | Admitting: Physical Therapy

## 2016-06-10 DIAGNOSIS — S069X9S Unspecified intracranial injury with loss of consciousness of unspecified duration, sequela: Secondary | ICD-10-CM

## 2016-06-10 DIAGNOSIS — R5381 Other malaise: Secondary | ICD-10-CM

## 2016-06-10 LAB — BASIC METABOLIC PANEL
Anion gap: 9 (ref 5–15)
BUN: 30 mg/dL — ABNORMAL HIGH (ref 6–20)
CO2: 22 mmol/L (ref 22–32)
CREATININE: 1.48 mg/dL — AB (ref 0.61–1.24)
Calcium: 9.6 mg/dL (ref 8.9–10.3)
Chloride: 107 mmol/L (ref 101–111)
Glucose, Bld: 145 mg/dL — ABNORMAL HIGH (ref 65–99)
Potassium: 4 mmol/L (ref 3.5–5.1)
SODIUM: 138 mmol/L (ref 135–145)

## 2016-06-10 LAB — CBC WITH DIFFERENTIAL/PLATELET
BASOS ABS: 0 10*3/uL (ref 0.0–0.1)
BASOS PCT: 0 %
EOS ABS: 0.5 10*3/uL (ref 0.0–0.7)
Eosinophils Relative: 7 %
HCT: 30.6 % — ABNORMAL LOW (ref 39.0–52.0)
HEMOGLOBIN: 10 g/dL — AB (ref 13.0–17.0)
Lymphocytes Relative: 33 %
Lymphs Abs: 2.3 10*3/uL (ref 0.7–4.0)
MCH: 30.1 pg (ref 26.0–34.0)
MCHC: 32.7 g/dL (ref 30.0–36.0)
MCV: 92.2 fL (ref 78.0–100.0)
MONOS PCT: 5 %
Monocytes Absolute: 0.3 10*3/uL (ref 0.1–1.0)
NEUTROS PCT: 55 %
Neutro Abs: 3.9 10*3/uL (ref 1.7–7.7)
Platelets: 215 10*3/uL (ref 150–400)
RBC: 3.32 MIL/uL — ABNORMAL LOW (ref 4.22–5.81)
RDW: 15.1 % (ref 11.5–15.5)
WBC: 7.1 10*3/uL (ref 4.0–10.5)

## 2016-06-10 LAB — URINE CULTURE

## 2016-06-10 MED ORDER — SODIUM CHLORIDE 0.9 % IV BOLUS (SEPSIS)
1000.0000 mL | Freq: Once | INTRAVENOUS | Status: AC
  Administered 2016-06-10: 1000 mL via INTRAVENOUS

## 2016-06-10 NOTE — Progress Notes (Signed)
Occupational Therapy Session Note  Patient Details  Name: [name] [name] MRN: [id number] Date of Birth: 11/23/1990  Today's Date: 06/10/2016 OT Individual Time: 1000-1100 OT Individual Time Calculation (min): 60 min     Short Term Goals: Week 2:  OT Short Term Goal 1 (Week 2): max A of 1 for squat pivot to toilet with grab bar (BSC over toilet for height) OT Short Term Goal 2 (Week 2): max A of 1 to push into full stand for toileting/ LB dressing OT Short Term Goal 3 (Week 2): max A of 1 to maintain static standing balance while caregiver pulls pants over hips OT Short Term Goal 4 (Week 2): mod cues to fully attend to LUE during UB dressing  Skilled Therapeutic Interventions/Progress Updates:    Pt resting in bed upon arrival with family member and interpreter present.  Pt required max encouragement to participate and actively engage during session.  Pt required max A to sit EOB and engaged in bathing/dressing tasks.  Pt required max A for sitting balance when not engaged functionally in bathing/dressing tasks.  Pt required tot A + 2 to perform sit<>stand to pull up pants.  Pt requested use of toilet and transferred to toilet with max A + 2 for clothing management.  Pt required tot A + 2 for hygiene and transfer back to w/c.  Pt remained in w/c with QRB in place and family present. Focus on active participation, sitting balance, sit<>stand, functional transfers, and safety awareness to increase independence with BADLs and reduce burden of care.  Therapy Documentation Precautions:  Precautions Precautions: Fall Precaution Comments: PEG Restrictions Weight Bearing Restrictions: No  Pain:  Pt did not c/o pain during session.  ADL: ADL ADL Comments: refer to functional navigator  See Function Navigator for Current Functional Status.   Therapy/Group: Individual Therapy  Rich BraveLanier, Edelmira Gallogly Chappell 06/10/2016, 12:23 PM

## 2016-06-10 NOTE — Progress Notes (Addendum)
[place] PHYSICAL MEDICINE & REHABILITATION     PROGRESS NOTE    Subjective/Complaints: Pt laying in bed this AM.  He appears more alert and slightly more interactive.  He responds "beuno" when asked how he is feeling and how is night was.   ROS: limited by cognition/language to an extent, denies pain.  Objective: Vital Signs: Blood pressure (!) 113/58, pulse 86, temperature 98.6 F (37 C), temperature source Axillary, resp. rate 16, height 5\' 7"  (1.702 m), weight 76.3 kg (168 lb 3.4 oz), SpO2 97 %. Dg Chest 2 View  Result Date: 06/09/2016 CLINICAL DATA:  Fever and lethargy.  Left-sided paralysis. EXAM: CHEST  2 VIEW COMPARISON:  One-view chest x-ray 05/21/2016 FINDINGS: Heart size is exaggerate by low lung volumes. Left basilar airspace disease has improved. There is no edema. Small effusions have improved. The visualized soft tissues and bony thorax are unremarkable. IMPRESSION: 1. Improving left lower lobe airspace disease. 2. Decreasing small bilateral pleural effusions. Electronically Signed   By: Marin Robertshristopher  Mattern M.D.   On: 06/09/2016 13:33    Recent Labs  06/10/16 0909  WBC 7.1  HGB 10.0*  HCT 30.6*  PLT 215    Recent Labs  06/08/16 0835 06/10/16 0909  NA 139 138  K 4.0 4.0  CL 103 107  GLUCOSE 123* 145*  BUN 25* 30*  CREATININE 1.44* 1.48*  CALCIUM 9.4 9.6   CBG (last 3)  No results for input(s): GLUCAP in the last 72 hours.  Wt Readings from Last 3 Encounters:  06/10/16 76.3 kg (168 lb 3.4 oz)  05/25/16 78.2 kg (172 lb 8 oz)    Physical Exam:  Gen: Well developed. NAD.  HENT: Normocephalic. Abrasions.  Eyes: EOMI in right eye. No discharge.  Neck: Trach stoma closed Cardiovascular: Normal rateand regular rhythm.  Respiratory: Effort normaland breath sounds normal. Good air movement. No wheezes or rales.  GI: Soft. Bowel sounds are normal. PEG tube intact Neurological: Dysconjugate gaze.  Tracks with right eye.  Follows simple one step  commands.  Left hemiparesis mAS 1+/4 wrist flexors LUE, Left hamstrings/gastrocs 1/4  Musculoskeletal: No edema. No tenderness. Psych: Unable to assess due to language.   Assessment/Plan: 1. Functional deficits secondary to TBI which require 3+ hours per day of interdisciplinary therapy in a comprehensive inpatient rehab setting. Physiatrist is providing close team supervision and 24 hour management of active medical problems listed below. Physiatrist and rehab team continue to assess barriers to discharge/monitor patient progress toward functional and medical goals.  Function:  Bathing Bathing position   Position: Sitting EOB  Bathing parts Body parts bathed by patient: Left arm, Chest, Right upper leg, Left upper leg, Abdomen Body parts bathed by helper: Right arm, Right lower leg, Left lower leg  Bathing assist Assist Level: 2 helpers      Upper Body Dressing/Undressing Upper body dressing   What is the patient wearing?: Pull over shirt/dress     Pull over shirt/dress - Perfomed by patient: Thread/unthread right sleeve, Put head through opening Pull over shirt/dress - Perfomed by helper: Pull shirt over trunk        Upper body assist Assist Level:  (total assist)      Lower Body Dressing/Undressing Lower body dressing   What is the patient wearing?: Pants, Non-skid slipper socks     Pants- Performed by patient: Thread/unthread right pants leg Pants- Performed by helper: Thread/unthread left pants leg, Pull pants up/down   Non-skid slipper socks- Performed by helper: Don/doff right sock,  Don/doff left sock       Shoes - Performed by helper: Don/doff right shoe, Don/doff left shoe, Fasten right, Fasten left       TED Hose - Performed by helper: Don/doff right TED hose, Don/doff left TED hose  Lower body assist Assist for lower body dressing: 2 Helpers      Toileting Toileting     Toileting steps completed by helper: Adjust clothing prior to toileting,  Performs perineal hygiene, Adjust clothing after toileting Toileting Assistive Devices: Grab bar or rail  Toileting assist Assist level: Two helpers   Transfers Chair/bed transfer   Chair/bed transfer method: Lateral scoot Chair/bed transfer assist level: dependent (Pt equals 0%) Chair/bed transfer assistive device: Sliding board Mechanical lift: Maximove   Locomotion Ambulation Ambulation activity did not occur: Safety/medical concerns   Max distance: 8 Assist level: 2 helpers   Wheelchair   Type: Manual (TIS )   Assist Level: Dependent (Pt equals 0%)  Cognition Comprehension Comprehension assist level: Understands basic 25 - 49% of the time/ requires cueing 50 - 75% of the time  Expression Expression assist level: Expresses basis less than 25% of the time/requires cueing >75% of the time.  Social Interaction Social Interaction assist level: Interacts appropriately 25 - 49% of time - Needs frequent redirection.  Problem Solving Problem solving assist level: Solves basic 25 - 49% of the time - needs direction more than half the time to initiate, plan or complete simple activities  Memory Memory assist level: Recognizes or recalls 25 - 49% of the time/requires cueing 50 - 75% of the time   Medical Problem List and Plan: 1.  Weakness, left hemiparesthesia secondary to TBI 03/02/2016 complicated by sepsis and respiratory failure             -continue therapies---CIR PT, OT, speech  -have extended patient's stay given the progress he's showing 2.  DVT Prophylaxis/Anticoagulation: Subcutaneous Lovenox.  -vascular studies are normal 3. Pain Management: Tylenol as needed  -gabapentin increased to 300mg  for neuropathic left-sided pain, reduced back to BID due to concerns of lethargy 4. Tracheostomy: trach site healing  5. Neuropsych: This patient is not capable of making decisions on his own behalf. 6. Skin/Wound Care: Routine skin checks 7. Fluids/Electrolytes/Nutrition: labs  improving 8. Dysphagia--now on D1/honey  -added megace for appetite  -scheduled formula has been stopped  -continue with reduced flushes  -keep protein supp 9. ESBL Klebsiella in sputum culture. Contact precautions 10.  HCAP. Antibiotic therapy completed   Currently afebrile 11. Gout. 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 Neurogenic Bladder/bowel:   -Cont I/O cath as needed  -remains incontinent of stool 14. AKI  Cr 1.48 on 10/25, trending up, BUN elevated  Colchicine dose reduced  Will order IVF qhs x1 on 10/25, due to diet  Will cont to monitor 15. Lethargy  CXR ordered as diet was advanced, reviewed from 10/24, improving  Ua/Ucx relatively unremarkable  Labs stable  Pain meds reduced  ?Improving  LOS (Days) 16 A FACE TO FACE EVALUATION WAS PERFORMED  Ankit Karis Juba 06/10/2016 1:55 PM

## 2016-06-10 NOTE — Progress Notes (Signed)
Speech Language Pathology Daily Session Note  Patient Details  Name: [name] [name] MRN: [id number] Date of Birth: 04/01/1991  Today's Date: 06/10/2016 SLP Individual Time: 1130-1227 SLP Individual Time Calculation (min): 57 min   Short Term Goals: Week 2: SLP Short Term Goal 1 (Week 2): Patient will consume current diet with minimal overt s/s of aspiration with Mod A verbal and question cues.  SLP Short Term Goal 2 (Week 2): Patient will verbally answer questions in 75% of opportunties with Mod A verbal cues.  SLP Short Term Goal 3 (Week 2): Patient will utilize speech intelligibility strategies at the phrase level with Mod A verbal cues to achieve 90% intelligibility. SLP Short Term Goal 4 (Week 2): Patient will demonstrate sustained attention to task for ~30 minutes with Min A verbal cues for redirection.  SLP Short Term Goal 5 (Week 2): Patient will demonstrate functional problem solving with basic and familiar tasks with Mod A verbal and visual cues.  SLP Short Term Goal 6 (Week 2): Patient will orient to time, place and situation with Min A verbal cues.   Skilled Therapeutic Interventions: Skilled therapy intervention focused on dysphagia and cognitive goals. Patient consumed lunch tray of Dys. 1 textures and honey-thick liquids with no overt s/s of aspiration. Patient demonstrated impulsivity with self-feeding, requiring Max A verbal cues for portion control and rate, and Total A multimodal cues for use of compensatory swallowing strategies. Patient requires Max A verbal cues to initiate voicing to make basic requests. Patient independently sustained attention to self-feeding for about 45 minutes. Patient left upright in w/c with family and translator in room. Continue current plan of care.   Function:  Eating Eating     Eating Assist Level: More than reasonable amount of time;Supervision or verbal cues;Helper checks for pocketed food;Helper scoops food on utensil;Help managing  cup/glass   Eating Set Up Assist For: Opening containers Helper Scoops Food on Utensil: Occasionally Helper Brings Food to Mouth: Occasionally   Cognition Comprehension Comprehension assist level: Understands basic 50 - 74% of the time/ requires cueing 25 - 49% of the time  Expression   Expression assist level: Expresses basis less than 25% of the time/requires cueing >75% of the time.  Social Interaction Social Interaction assist level: Interacts appropriately 25 - 49% of time - Needs frequent redirection.  Problem Solving Problem solving assist level: Solves basic 25 - 49% of the time - needs direction more than half the time to initiate, plan or complete simple activities  Memory Memory assist level: Recognizes or recalls 25 - 49% of the time/requires cueing 50 - 75% of the time    Pain Pain Assessment Pain Assessment: No/denies pain  Therapy/Group: Individual Therapy  Caryn Sectionlison Aylah Yeary 06/10/2016, 2:57 PM

## 2016-06-10 NOTE — Progress Notes (Signed)
Occupational Therapy Session Note  Patient Details  Name: [place] CraneOmar Deason MRN: [id number] Date of Birth: 02/23/1991  Today's Date: 06/10/2016 OT Individual Time:  -        Short Term Goals: Week 2:  OT Short Term Goal 1 (Week 2): max A of 1 for squat pivot to toilet with grab bar (BSC over toilet for height) OT Short Term Goal 2 (Week 2): max A of 1 to push into full stand for toileting/ LB dressing OT Short Term Goal 3 (Week 2): max A of 1 to maintain static standing balance while caregiver pulls pants over hips OT Short Term Goal 4 (Week 2): mod cues to fully attend to LUE during UB dressing  Skilled Therapeutic Interventions/Progress Updates:     Therapy Documentation Precautions:  Precautions Precautions: Fall Precaution Comments: PEG Restrictions Weight Bearing Restrictions: No   Pain:Interpreter, niece & er 5 month baby and his mom were present for this OT session.  He participated in skilled OT today as follows: Assisted with drinking thickened cola (approx 6 ounces) as requested at the start of the session.   He was able to follow verbal reminders to have slow sips with 2 swallows afterwards  Required mod assist x2 to come to sit at front of chair in prep for sit to stand.   Patient with decreased receptive communication and upper and lower body strength and stabiliation.   As well, patient tended to keep his right knee flexed and required constant verbal or tactile cues to keep his right foot on the floor in prep for sit to stand  Sit to stand=Moderate Assist x2  For 3 tries;    Patient required tactile cues for input to cue left lower extremity to bear weight.   And at times he required cues to weight bear through right lower extremity as well as tactile cues to tuck bottom and hold up chest.    Patient required max tactile input to weight bear through left upper extremity while attempting to stand at sink x 3.   With moderate physical assist of two people, patient was able to  tolerate static standing balance for approximately 15 seconds per try before either patient became a little winded and body became too weak to maintain neutral body alignment with assist or he looked back at the chair and started to sit himself into it.  At end of session patient was left in the hands of his Speech Language Pathologists, with safety/ positioning belt in place and mom, niece and interpreter near by.   See Function Navigator for Current Functional Status.   Therapy/Group: Individual Therapy  Rozelle Loganickett, Booker Bhatnagar Yeary 06/10/2016, 7:38 PM

## 2016-06-10 NOTE — Patient Care Conference (Signed)
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 Lethargic   . AKI (acute kidney injury) (HCC)   . Neurogenic bowel   . Neurogenic bladder   . Oropharyngeal dysphagia   . Neuropathic pain   . 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 FUO (fever of unknown origin)   . ESBL (extended spectrum beta-lactamase) producing bacteria infection   . Effusion of left knee   . Sepsis (HCC) 05/16/2016  . Tachycardia 05/16/2016  . S/P percutaneous endoscopic gastrostomy (PEG) tube placement (HCC) 05/16/2016  . Tracheostomy in place [facility]) 05/16/2016    Expected Discharge Date: Expected Discharge Date: 06/19/16  Team Members Present: Physician leading conference: Dr. Maryla Morrow Social Worker Present: Amada Jupiter, LCSW Nurse Present: Chana Bode, RN PT Present: Aleda Grana, PT OT Present: Ardis Rowan, COTA;Jennifer Katrinka Blazing, OT SLP Present: Feliberto Gottron, SLP PPS Coordinator present : Tora Duck, RN, CRRN     Current Status/Progress Goal Weekly Team Focus  Medical   Weakness, left hemiparesthesia secondary to TBI 03/02/2016 complicated by sepsis  Improve safety, transfers, ?pain, dysphagia, neurogenic bowel/bladder, AKI  See  above   Bowel/Bladder   incontinent of bowel/bladder. LBM 10/23  to be continent of bowel/bladder while in [facility] with max assist  Monitor bowel/bladder function and timed toilet patient q2hr   Swallow/Nutrition/ Hydration   Dys. 1 textures with honey-thick liquids, Max A for use of swallowing strategies   Mod A with least restrictive diet  tolerance of current diet, use of swallowing strategies    ADL's   mod A bathing, mod UB dressing, max LB dressing, total A of 2 toileting, max A of 2 for standing and transfers, mod-max slide board of 1  min bathing (bed level), min UB dressing, mod LB dressing and toilet transfer, min static sitting, max dynamic sitting  family education, functional mobility, sitting balance, trunk control, LUE NMR, cognition, visual tracking   Mobility   +2 assist for transfers, max assist bed mobility, limited by lethargy/fatigue, decreased ability to follow commands  max assist ambulation, min assist w/c level, mod assist transfers  L NMR, transfers, pt/family education, balance   Communication   Mod-Max A for initiation of verbal expression and increased vocal intensity   Min A  increased vocal intensity/intelligibility, initiaiton of wants/neeeds   Safety/Cognition/ Behavioral Observations  Rancho Level V, Max A  Min A  attention, initiation, orientation    Pain   occassional L knee pain- tylenol prn effective  <2  assess and treat pain q shift and as needed   Skin   pag tube, old trach site and abrasions  skin to be free of breakdown/infection while in [facility] with max  assist  monitor skin q shift and as needed    Rehab Goals Patient on target to meet rehab goals: Yes *See Care Plan and progress notes for long and short-term goals.  Barriers to Discharge: Cognitive decline, safety, transfers, ?pain meds, dysphagia, neuogenic bowel/bladder, AKI    Possible Resolutions to Barriers:  Therapies, optimize pain meds with ?sedeation, await Ucx, CXR ordered    Discharge  Planning/Teaching Needs:  Plan to d/c home with family providing 24/7 assistance      Team Discussion:  Declining fxn past couple of days;  More lethargic yesterday.  MD aware and checking labs and CXR.  There were some med changes over weekend and will decrease.  On track to meet goals and to begin family education.  Revisions to Treatment Plan:  None   Continued Need for Acute Rehabilitation Level of Care: The patient requires daily medical management by a physician with specialized training in physical medicine and rehabilitation for the following conditions: Daily direction of a multidisciplinary physical rehabilitation program to ensure safe treatment while eliciting the highest outcome that is of practical value to the patient.: Yes Daily medical management of patient stability for increased activity during participation in an intensive rehabilitation regime.: Yes Daily analysis of laboratory values and/or radiology reports with any subsequent need for medication adjustment of medical intervention for : Neurological problems;Pulmonary problems;Nutritional problems  Tyquasia Pant 06/10/2016, 6:31 PM

## 2016-06-10 NOTE — Progress Notes (Signed)
Physical Therapy Session Note  Patient Details  Name: [name] [name] MRN: [id number] Date of Birth: 05/17/1991  Today's Date: 06/10/2016 PT Individual Time: 1306-1403 PT Individual Time Calculation (min): 57 min    Short Term Goals: Week 3:  PT Short Term Goal 1 (Week 3): Patient will consistently perform functional transfers with moderate assistance  PT Short Term Goal 2 (Week 3): Patient will consistenly follow one step instructions 100% of the time.  PT Short Term Goal 3 (Week 3): Patient will propel wheelchair 25 feet with max assist.  PT Short Term Goal 4 (Week 3): Patient will stand with least restrictive assistive device for 90 seconds with max assistx1.   Skilled Therapeutic Interventions/Progress Updates:    Pt received in bed with professional interpreter present for session. Pt agreeable to tx, denying c/o pain at rest. Pt transferred supine>sitting EOB with max assist +1 with multimodal cuing for initiation of task and weight shifting. Therapist provided total assist for sliding board placement and provided tactile & verbal cuing for anterior weight shifting and weight shifting buttocks across board; pt able to complete transfer with max assist +1 & steady assist from rehab tech for safety. Transported pt room>dayroom where he transferred onto mat table in same manner noted. Pt with improved ability to transfer to R versus L. While sitting on mat table interventions focused on sitting balance, attention to L, BLE strengthening & neuro re-ed. Pt performed reaching outside of BOS in all directions for cups; pt required min assist to correct loss of balance when reaching to L. Pt kicked ball with BLE with max encouragement to utilize BLE; pt with active quad activation in LLE. Therapist provided manual facilitation for anterior pelvic tilt, scapular retraction and neutral head alignment for improved posture; pt with occasional c/o pain with correction of postural alignment. Pt returned to w/c  via slide board in same method as noted prior. Educated pt on w/c mobility with R hemi technique but pt attempted to use RUE only. Instructed pt to use RLE and pt able to propel w/c ~70 ft with max assist and max encouragement; pt also required cuing for steering. At end of session pt left sitting in w/c in room with QRB in place and interpreter & niece present; all needs within reach. Educated pt's family member to notify nursing staff of when she left or when pt wanted to transfer out of w/c & she voiced understanding.   [name] present for latter portion of session.   Therapy Documentation Precautions:  Precautions Precautions: Fall Precaution Comments: PEG Restrictions Weight Bearing Restrictions: No   See Function Navigator for Current Functional Status.   Therapy/Group: Individual Therapy  Sandi MariscalVictoria M Makynlee Kressin 06/10/2016, 6:01 PM

## 2016-06-11 ENCOUNTER — Inpatient Hospital Stay (HOSPITAL_COMMUNITY): Payer: Medicaid Other | Admitting: Speech Pathology

## 2016-06-11 ENCOUNTER — Inpatient Hospital Stay (HOSPITAL_COMMUNITY): Payer: Medicaid Other | Admitting: Occupational Therapy

## 2016-06-11 ENCOUNTER — Inpatient Hospital Stay (HOSPITAL_COMMUNITY): Payer: Self-pay | Admitting: Physical Therapy

## 2016-06-11 MED ORDER — POLYVINYL ALCOHOL 1.4 % OP SOLN
1.0000 [drp] | OPHTHALMIC | Status: DC | PRN
  Administered 2016-06-11 (×2): 1 [drp] via OPHTHALMIC
  Filled 2016-06-11: qty 15

## 2016-06-11 NOTE — Progress Notes (Signed)
Physical Therapy Note  Patient Details  Name: [name] MRN: [id number] Date of Birth: 01/04/1991 Today's Date: 06/11/2016    Time: 1000-1100 60 minutes  1:1 No c/o pain. Pt requires mod A for bed mobility, improved initiation with getting LE out of bed and pushing up with trunk. Pt continues to be max A with transfers bed <> chair with pt requiring increased time for processing and max encouragement to assist with transfers. Pt transferred to nu step with max A, pt able to pedal with B LEs and Rt UE x 2 minutes, then pt states he needs to use restroom. Pt transferred to toilet using grab bar with max A, mod cuing and increased time. Pt performed multiple sit to stands at railing in bathroom for clothing negotiation and hygiene with cues for posture due to Lt and anterior lean.  Continued sit to stand training at railing in hallway with focus on posture and pt participation. Pt requires increased assist due to fatigue at end of session.  Time 2: 1300-1330 30 minutes  1:1 No c/o pain.  Pt transferred to nu step with max A. Pt able to perform nustep with cues for continuing task x 7 minutes followed by 3 minutes at level 5. Pt propels with B LEs and Rt UE. Pt able to continuously pedal > 2 minutes before requiring encouragement to continue. Pt able to express and voice need of a drink, pt continues to need max cuing for small sips of drink.   Mercia Dowe 06/11/2016, 3:04 PM

## 2016-06-11 NOTE — Progress Notes (Signed)
Speech Language Pathology Weekly Progress and Session Note  Patient Details  Name: [name] [name] MRN: [id number] Date of Birth: 07-Apr-1991  Beginning of progress report period: June 03, 2016 End of progress report period: June 11, 2016  Today's Date: 06/11/2016 SLP Individual Time: 1330-1430 SLP Individual Time Calculation (min): 60 min   Short Term Goals: Week 2: SLP Short Term Goal 1 (Week 2): Patient will consume current diet with minimal overt s/s of aspiration with Mod A verbal and question cues.  SLP Short Term Goal 1 - Progress (Week 2): Not met SLP Short Term Goal 2 (Week 2): Patient will verbally answer questions in 75% of opportunties with Mod A verbal cues.  SLP Short Term Goal 2 - Progress (Week 2): Met SLP Short Term Goal 3 (Week 2): Patient will utilize speech intelligibility strategies at the phrase level with Mod A verbal cues to achieve 90% intelligibility. SLP Short Term Goal 3 - Progress (Week 2): Met SLP Short Term Goal 4 (Week 2): Patient will demonstrate sustained attention to task for ~30 minutes with Min A verbal cues for redirection.  SLP Short Term Goal 4 - Progress (Week 2): Met SLP Short Term Goal 5 (Week 2): Patient will demonstrate functional problem solving with basic and familiar tasks with Mod A verbal and visual cues.  SLP Short Term Goal 5 - Progress (Week 2): Met SLP Short Term Goal 6 (Week 2): Patient will orient to time, place and situation with Min A verbal cues.  SLP Short Term Goal 6 - Progress (Week 2): Not met    New Short Term Goals: Week 3: SLP Short Term Goal 1 (Week 3): Patient will consume current diet with minimal overt s/s of aspiration with Mod A verbal and question cues.  SLP Short Term Goal 2 (Week 3): Patient will verbally answer questions in 75% of opportunties with Min A verbal cues.  SLP Short Term Goal 3 (Week 3): Patient will utilize speech intelligibility strategies at the phrase level with Min A verbal cues to achieve 90%  intelligibility. SLP Short Term Goal 4 (Week 3): Patient will demonstrate selective attention to task in a mildly distracting enviornment for ~30 minutes with Min A verbal cues for redirection.  SLP Short Term Goal 5 (Week 3): Patient will demonstrate functional problem solving with basic and familiar tasks with Min A verbal and visual cues.  SLP Short Term Goal 6 (Week 3): Patient will orient to time, place and situation with Min A verbal cues.   Weekly Progress Updates: Patient has made functional gains and has met 4 of 6 STG's this reporting period due to improved cognition and functional communication. Currently, patient demonstrates behaviors consistent with a Rancho Level VI and requires overall Mod-Max A multimodal cues for functional problem solving, initiation, and sustained attention. Patient also requires overall Max-Total A for awareness and recall. Patient demonstrates increased verbal initiation and is ~80% intelligible at the phrase level with overall Mod A multimodal cues. Patient is also consuming Dys. 1 textures with honey-thick liquids with minimal overt s/s of aspiration but requires Max-Total A for use of swallowing compensatory strategies. Patient and family education is ongoing. Patient would benefit from continued skilled SLP intervention to maximize his cognitive and swallowing function, functional communication, and overall functional independence prior to discharge.    Intensity: Minumum of 1-2 x/day, 30 to 90 minutes Frequency: 3 to 5 out of 7 days Duration/Length of Stay: 11/3 Treatment/Interventions: Cognitive remediation/compensation;Cueing hierarchy;Functional tasks;Patient/family education;Environmental controls;Therapeutic Activities;Dysphagia/aspiration precaution training;Internal/external aids;Speech/Language  facilitation   Daily Session  Skilled Therapeutic Interventions:  Skilled treatment session focused on cognitive goals. SLP facilitated session by providing  Mod A verbal, visual and question cues for functional problem solving during a basic money management task. Suspect function was impacted by lethargy in which patient required Mod A verbal cues to demonstrate selective attention to tasks in a mildly distracting environment for ~30 minutes. Patient with increased verbalizations today in which he responded to ~75% of questions with Min A verbal cues needed for increased vocal intensity. Patient transferred back to bed at end of session with total +2 assist. Patient left supine in bed with all needs within reach. Continue with current plan of care.       Function:   Eating Eating   Modified Consistency Diet: Yes Eating Assist Level: More than reasonable amount of time;Supervision or verbal cues;Helper checks for pocketed food;Helper scoops food on utensil;Help managing cup/glass   Eating Set Up Assist For: Opening containers Helper Scoops Food on Utensil: Occasionally Helper Brings Food to Mouth: Occasionally   Cognition Comprehension Comprehension assist level: Understands basic 50 - 74% of the time/ requires cueing 25 - 49% of the time  Expression   Expression assist level: Expresses basis less than 25% of the time/requires cueing >75% of the time.  Social Interaction Social Interaction assist level: Interacts appropriately 25 - 49% of time - Needs frequent redirection.  Problem Solving Problem solving assist level: Solves basic 25 - 49% of the time - needs direction more than half the time to initiate, plan or complete simple activities  Memory Memory assist level: Recognizes or recalls 25 - 49% of the time/requires cueing 50 - 75% of the time   Pain Pain Assessment Pain Assessment: No/denies pain  Therapy/Group: Individual Therapy  [name] [name] 06/11/2016, 4:12 PM
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
## 2016-06-11 NOTE — Progress Notes (Signed)
Occupational Therapy Session Note  Patient Details  Name: [name] CraneOmar Orman MRN: [id number] Date of Birth: 10/08/1990  Today's Date: 06/11/2016 OT Individual Time: 1100-1200 OT Individual Time Calculation (min): 60 min     Short Term Goals:Week 2:  OT Short Term Goal 1 (Week 2): max A of 1 for squat pivot to toilet with grab bar (BSC over toilet for height) OT Short Term Goal 2 (Week 2): max A of 1 to push into full stand for toileting/ LB dressing OT Short Term Goal 3 (Week 2): max A of 1 to maintain static standing balance while caregiver pulls pants over hips OT Short Term Goal 4 (Week 2): mod cues to fully attend to LUE during UB dressing     Skilled Therapeutic Interventions/Progress Updates:    Much improved participation today. Continued family education with pt's sister, [name]. Pt initially declined shower, but then agreed to it. Completed w/c >< roll in shower chair squat pivot with max of 2.  Improved forward lean, initiation in shower to wash more body parts. Pt able to reach R arm under open seat in shower chair to wash buttocks. For dressing, used minimal verbal cues and allowed pt extra time to problem solve what to do. Pt assisted to donning L arm in sleeve and then continued task himself.  Pt wanted help to don R foot in pants and shoe, but he was told he needed to do and no further cues. Pt was then able to do it himself after 2 minutes.  Brushed teeth at sink. LUE NMR with A/AROM with sliding arm back and forth for sh and elbow flex/ext on slide board on lap. Finger flexion to grab pillow case 3x with extra time and cues. Pt in chair with quick release belt on and sister with pt.   Therapy Documentation Precautions:  Precautions Precautions: Fall Precaution Comments: PEG Restrictions Weight Bearing Restrictions: No    Vital Signs: Therapy Vitals Pulse Rate: 67 BP: 119/80 Pain: Pain Assessment Pain Assessment: No/denies pain ADL: ADL ADL Comments: refer to functional  navigator  See Function Navigator for Current Functional Status.   Therapy/Group: Individual Therapy  Maikol Grassia 06/11/2016, 12:46 PM

## 2016-06-11 NOTE — Progress Notes (Signed)
Social Work Patient ID: Javione Gunawan, male   DOB: November 29, 1990, 25 y.o.   MRN: [id number]   Met with pt's sister this afternoon to review team conference and d/c planning issues.  Family aware that we continue to plan toward 11/3 discharge and need to complete formal family education next week.  Requested that they complete education in smaller groups so have broken the family up for each day next week.  They are also intending to take pt home in a van and I have alerted therapists to determine if this is reasonable to do.  Family with questions about follow up services and DME.  Will also need to provide medication assistance and referral to local medical clinic as pt continues to be Medicaid pending.    Lorain Fettes, LCSW

## 2016-06-12 ENCOUNTER — Inpatient Hospital Stay (HOSPITAL_COMMUNITY): Payer: Self-pay | Admitting: Physical Therapy

## 2016-06-12 ENCOUNTER — Inpatient Hospital Stay (HOSPITAL_COMMUNITY): Payer: Medicaid Other | Admitting: Speech Pathology

## 2016-06-12 ENCOUNTER — Inpatient Hospital Stay (HOSPITAL_COMMUNITY): Payer: Medicaid Other | Admitting: Occupational Therapy

## 2016-06-12 LAB — BASIC METABOLIC PANEL
Anion gap: 9 (ref 5–15)
BUN: 27 mg/dL — AB (ref 6–20)
CHLORIDE: 108 mmol/L (ref 101–111)
CO2: 21 mmol/L — ABNORMAL LOW (ref 22–32)
Calcium: 9.7 mg/dL (ref 8.9–10.3)
Creatinine, Ser: 1.32 mg/dL — ABNORMAL HIGH (ref 0.61–1.24)
Glucose, Bld: 93 mg/dL (ref 65–99)
POTASSIUM: 4.4 mmol/L (ref 3.5–5.1)
SODIUM: 138 mmol/L (ref 135–145)

## 2016-06-12 MED ORDER — BACLOFEN 10 MG PO TABS
10.0000 mg | ORAL_TABLET | Freq: Three times a day (TID) | ORAL | Status: DC
  Administered 2016-06-12 – 2016-06-17 (×16): 10 mg
  Filled 2016-06-12 (×17): qty 1
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
## 2016-06-12 NOTE — Progress Notes (Signed)
Physical Therapy Note  Patient Details  Name: [name] CraneOmar Gladden MRN: [id number] Date of Birth: 08/26/1990 Today's Date: 06/12/2016    Time: 1300-1415 75 minutes  1:1 No c/o pain. Simulated car transfer to mini van height with sliding board. Pt able to perform with mod A to slide, max A to scoot back on seat to prevent fall, assist for Lt LE into car.  Pt able to exit car without sliding board with mod A.  Repetitions of scooting forward and backward and laterally in chair with pt varying between mod/max A depending on fatigue.  Bed mobility in ADL apartment with pt able to perform with min A for sit to supine, mod A supine to sit, assist for Lt LE and trunk.  Nu step for NMR and activity tolerance level 5 x 8 minutes.  Gait with 3 musketeers x 20' with assist for wt shift and Lt LE advancement.  W/c mobility in controlled environment with mod A, cues to use Rt LE, improved with repetition and time, pt able to negotiation doorways with mod A.   Lizette Pazos 06/12/2016, 2:16 PM
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
## 2016-06-12 NOTE — Progress Notes (Signed)
[place] PHYSICAL MEDICINE & REHABILITATION     PROGRESS NOTE    Subjective/Complaints: Having spasms sill in left leg (painful). Otherwise denies any issues  ROS: limited by cognition/language to an extent still.  Objective: Vital Signs: Blood pressure 128/77, pulse 70, temperature 98 F (36.7 C), temperature source Oral, resp. rate 18, height 5\' 7"  (1.702 m), weight 76 kg (167 lb 8.8 oz), SpO2 100 %. No results found.  Recent Labs  06/10/16 0909  WBC 7.1  HGB 10.0*  HCT 30.6*  PLT 215    Recent Labs  06/10/16 0909 06/12/16 0718  NA 138 138  K 4.0 4.4  CL 107 108  GLUCOSE 145* 93  BUN 30* 27*  CREATININE 1.48* 1.32*  CALCIUM 9.6 9.7   CBG (last 3)  No results for input(s): GLUCAP in the last 72 hours.  Wt Readings from Last 3 Encounters:  06/12/16 76 kg (167 lb 8.8 oz)  05/25/16 78.2 kg (172 lb 8 oz)    Physical Exam:  Gen: Well developed. NAD.  HENT: Normocephalic. Abrasions.  Eyes: EOMI in right eye. No discharge.  Neck: Trach stoma closed Cardiovascular: Normal rateand regular rhythm.  Respiratory: Effort normaland breath sounds normal. Good air movement. No wheezes or rales.  GI: Soft. Bowel sounds are normal. PEG tube intact Neurological: Dysconjugate gaze.  Tracks with right eye.  Follows simple one step commands.  Left hemiparesis mAS 1+/4 wrist flexors LUE, Left hamstrings/gastrocs 1/4  Musculoskeletal: No edema. No tenderness. Psych: Unable to assess due to language.   Assessment/Plan: 1. Functional deficits secondary to TBI which require 3+ hours per day of interdisciplinary therapy in a comprehensive inpatient rehab setting. Physiatrist is providing close team supervision and 24 hour management of active medical problems listed below. Physiatrist and rehab team continue to assess barriers to discharge/monitor patient progress toward functional and medical goals.  Function:  Bathing Bathing position   Position: Shower  Bathing  parts Body parts bathed by patient: Left arm, Chest, Right upper leg, Left upper leg, Abdomen, Front perineal area, Buttocks Body parts bathed by helper: Right arm, Right lower leg, Left lower leg, Back  Bathing assist Assist Level: 2 helpers      Upper Body Dressing/Undressing Upper body dressing   What is the patient wearing?: Pull over shirt/dress     Pull over shirt/dress - Perfomed by patient: Thread/unthread right sleeve, Put head through opening, Pull shirt over trunk Pull over shirt/dress - Perfomed by helper: Thread/unthread left sleeve        Upper body assist Assist Level:  (total assist)      Lower Body Dressing/Undressing Lower body dressing   What is the patient wearing?: Pants, Ted Hose, Shoes     Pants- Performed by patient: Thread/unthread right pants leg Pants- Performed by helper: Thread/unthread left pants leg, Pull pants up/down   Non-skid slipper socks- Performed by helper: Don/doff right sock, Don/doff left sock     Shoes - Performed by patient: Don/doff right shoe Shoes - Performed by helper: Don/doff left shoe, Fasten right, Fasten left       TED Hose - Performed by helper: Don/doff right TED hose, Don/doff left TED hose  Lower body assist Assist for lower body dressing: 2 Helpers      Toileting Toileting     Toileting steps completed by helper: Adjust clothing prior to toileting, Performs perineal hygiene, Adjust clothing after toileting Toileting Assistive Devices: Grab bar or rail  Toileting assist Assist level: Two helpers  Transfers Chair/bed transfer   Chair/bed transfer method: Lateral scoot Chair/bed transfer assist level: 2 helpers (max assist + steady assist for safety) Chair/bed transfer assistive device: Sliding board Mechanical lift: Maximove   Locomotion Ambulation Ambulation activity did not occur: Safety/medical concerns   Max distance: 8 Assist level: 2 helpers   Wheelchair   Type: Manual (TIS )   Assist Level:  Dependent (Pt equals 0%)  Cognition Comprehension Comprehension assist level: Understands basic 50 - 74% of the time/ requires cueing 25 - 49% of the time  Expression Expression assist level: Expresses basic 25 - 49% of the time/requires cueing 50 - 75% of the time. Uses single words/gestures.  Social Interaction Social Interaction assist level: Interacts appropriately 25 - 49% of time - Needs frequent redirection.  Problem Solving Problem solving assist level: Solves basic 25 - 49% of the time - needs direction more than half the time to initiate, plan or complete simple activities  Memory Memory assist level: Recognizes or recalls 25 - 49% of the time/requires cueing 50 - 75% of the time   Medical Problem List and Plan: 1.  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  DVT Prophylaxis/Anticoagulation: Subcutaneous Lovenox.  -vascular studies are normal 3. Pain Management: Tylenol as needed  -gabapentin increased to 300mg  for neuropathic left-sided pain, reduced back to BID due to concerns of lethargy 4. Tracheostomy: trach site healed 5. Neuropsych: This patient is not capable of making decisions on his own behalf. 6. Skin/Wound Care: Routine skin checks 7. Fluids/Electrolytes/Nutrition: labs improving 8. Dysphagia--now on D1/honey  -continue megace for appetite  -scheduled formula has been stopped  -continue with reduced flushes  -keep protein supp 9. ESBL Klebsiella in sputum culture. Contact precautions 10.  HCAP. Antibiotic therapy completed   Currently afebrile 11. Gout. 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 Neurogenic Bladder/bowel:   -Cont I/O cath as needed  -remains incontinent of stool 14. AKI  Cr back to 1.3 today  Colchicine held  Received IVF  Discussed with patient and family about  importance of fluids  -advancement of liquids would be nice 15. Lethargy  -some improvement. ?med related  -continue to monitor clinically   LOS (Days) 18 A FACE TO FACE EVALUATION WAS PERFORMED  Nial Hawe T 06/12/2016 8:53 AM

## 2016-06-12 NOTE — Progress Notes (Signed)
Occupational Therapy Weekly Progress Note  Patient Details  Name: [name] [name] MRN: [id number] Date of Birth: Feb 10, 1991  Beginning of progress report period: June 03, 2016 End of progress report period: June 12, 2016  Today's Date: 06/12/2016 OT Individual Time: 1045-1200 OT Individual Time Calculation (min): 75 min    Patient has met 4 of 4 short term goals.  Pt has made strong progress these last 2 days with his attention and initiation for self care tasks. He has also been putting in more effort and therefore can now transfer with assist of only 1 person with strong tactile feedback to lean forward. He is standing with RUE support with mod A.  Overall he has progressed from max A of 2 helpers to mod A with bathing, min UB dressing, max transfers.  Plans are for 4 days of family ed next week to prepare for discharge at end of the week.   Patient continues to demonstrate the following deficits: cognitive deficits of limited attention span/ impulsivity, LUE/LLE hemiplegia/ increased tone, visual impairments of midline orientation, decreased trunk control in standing and severely limited activity tolerance and therefore will continue to benefit from skilled OT intervention to enhance overall performance with BADL and Reduce care partner burden.  Patient progressing toward long term goals..  Continue plan of care.  OT Short Term Goals Week 1:  OT Short Term Goal 1 (Week 1): Pt will roll to L with mod A of 1 to assist caregivers with LB dressing.  OT Short Term Goal 1 - Progress (Week 1): Met OT Short Term Goal 2 (Week 1): Pt will roll to R with max A of 1 to assist caregivers with LB dressing. OT Short Term Goal 2 - Progress (Week 1): Met OT Short Term Goal 3 (Week 1): Pt will tolerate sitting EOB for 15 min with min A to engage in grooming activities. OT Short Term Goal 3 - Progress (Week 1): Met OT Short Term Goal 4 (Week 1): Pt will demonstrate improved motor planning with bathing  with mod A bed level. OT Short Term Goal 4 - Progress (Week 1): Met OT Short Term Goal 5 (Week 1): Pt will complete slide board transfer with max A of 2 helpers. OT Short Term Goal 5 - Progress (Week 1): Met (not using slide board, max A of 2 squat pivot) Week 2:  OT Short Term Goal 1 (Week 2): max A of 1 for squat pivot to toilet with grab bar (BSC over toilet for height) OT Short Term Goal 1 - Progress (Week 2): Met OT Short Term Goal 2 (Week 2): max A of 1 to push into full stand for toileting/ LB dressing OT Short Term Goal 2 - Progress (Week 2): Met OT Short Term Goal 3 (Week 2): max A of 1 to maintain static standing balance while caregiver pulls pants over hips OT Short Term Goal 3 - Progress (Week 2): Met OT Short Term Goal 4 (Week 2): mod cues to fully attend to LUE during UB dressing OT Short Term Goal 4 - Progress (Week 2): Met Week 3:  OT Short Term Goal 1 (Week 3): STGs = LTGs       Skilled Therapeutic Interventions/Progress Updates:    Pt seen this session for ADL retraining and family ed with his brother.  Pt received in bed somewhat lethargic.  With strong verbal cues, rolled to L with min A, mod cues to kick legs off bed and use R arm to push up  into sitting with steadying A. Squat pivot transfer to L to w/c with only min A as pt maintained strong wt shift forward.  Pt requested to toilet.  Used R hand on grab bar for max A to transfer to toilet and max to stand for clean up. Completed squat pivot w/c to tub bench using grab bar with mod A to L and max to R. Did stand briefly one time in shower by pulling up on tall vertical bar.  Good initiation in shower with increased alertness.  W/c positioned at end of bed to allow him to use R hand for sit to stand.  Stood 2x with only min A, then  3rd with mod A. The 4th stand was max A. Pt stating he was too tired. Each stand less than 30 sec.  Pt completed more with dressing with mod cues although continues to need max A LB.   Pt  requesting juice. Drank 2 honey thick juice with max cues to go slow, small sips and 2 swallows. His brother was very involved in session by cuing pt safely with swallowing. Pt in w/c with quick release belt, lap tray. In room with brother.   Therapy Documentation Precautions: Precautions Precautions: Fall Precaution Comments: PEG Restrictions Weight Bearing Restrictions: No    Vital Signs: Therapy Vitals Pulse Rate: 73 BP: 131/75 Pain:   no c/o pain this session  ADL: ADL ADL Comments: refer to functional navigator See Function Navigator for Current Functional Status.   Therapy/Group: Individual Therapy  [place] 06/12/2016, 12:24 PM

## 2016-06-13 ENCOUNTER — Inpatient Hospital Stay (HOSPITAL_COMMUNITY): Payer: Self-pay | Admitting: *Deleted

## 2016-06-13 MED ORDER — GABAPENTIN 250 MG/5ML PO SOLN
300.0000 mg | Freq: Two times a day (BID) | ORAL | Status: DC
  Administered 2016-06-13 – 2016-06-17 (×9): 300 mg
  Filled 2016-06-13 (×14): qty 6

## 2016-06-13 NOTE — Progress Notes (Signed)
Occupational Therapy Session Note  Patient Details  Name: [name] [name] MRN: [id number] Date of Birth: 05/27/1991  Today's Date: 06/13/2016 OT Individual Time: 1030-1205 OT Individual Time Calculation (min): 95 min    Skilled Therapeutic Interventions/Progress Updates:   Patient participated in skilled OT that especially address neuro re-education, family/patient training and patient cognition incorporated in functional activities.  He was able to complete lower and upper extremities and brain skills via reciprical movements on the NuStep and sit to stand.  [name] was effective in faciliating a good balance of assist with giving his brother time to problem solve during his activities today.  Patient required transfer assist initially with max assist stand pivot and after fatigued at end of session with moderate assist x 2 people  Patient was left with his brother at the end of the session.  [name] was preparing to help Mr. [name] [name] with safely eating his lunch.  Therapy Documentation Precautions:  Precautions Precautions: Fall Precaution Comments: PEG Restrictions Weight Bearing Restrictions: No  Pain: Pain Assessment Pain Assessment: No/denies pain  See Function Navigator for Current Functional Status.   Therapy/Group: Individual Therapy  Bud Faceickett, Anastassia Noack [facility] 06/13/2016, 5:23 PM

## 2016-06-13 NOTE — Progress Notes (Signed)
[place] PHYSICAL MEDICINE & REHABILITATION     PROGRESS NOTE    Subjective/Complaints: Slept well. Still with left leg spasms  ROS: limited by cognition/language to an extent still.  Objective: Vital Signs: Blood pressure 112/66, pulse 63, temperature 98.9 F (37.2 C), temperature source Oral, resp. rate 18, height 5\' 7"  (1.702 m), weight 75.1 kg (165 lb 9.1 oz), SpO2 99 %. No results found. No results for input(s): WBC, HGB, HCT, PLT in the last 72 hours.  Recent Labs  06/12/16 0718  NA 138  K 4.4  CL 108  GLUCOSE 93  BUN 27*  CREATININE 1.32*  CALCIUM 9.7   CBG (last 3)  No results for input(s): GLUCAP in the last 72 hours.  Wt Readings from Last 3 Encounters:  06/13/16 75.1 kg (165 lb 9.1 oz)  05/25/16 78.2 kg (172 lb 8 oz)    Physical Exam:  Gen: Well developed. NAD.  HENT: Normocephalic. Abrasions.  Eyes: EOMI in right eye. No discharge.  Neck: Trach stoma closed.  Cardiovascular: Normal rateand regular rhythm.  Respiratory: Effort normaland breath sounds normal. Good air movement. No wheezes or rales.  GI: Soft. Bowel sounds are normal. PEG tube remains intact Neurological: Dysconjugate gaze  Tracks with right eye.  Follows simple one step commands.  Left hemiparesis mAS 1+/4 wrist flexors LUE, Left hamstrings/gastrocs 1 to 1+/4  Musculoskeletal: No edema. No tenderness. Psych: Unable to assess due to language.   Assessment/Plan: 1. Functional deficits secondary to TBI which require 3+ hours per day of interdisciplinary therapy in a comprehensive inpatient rehab setting. Physiatrist is providing close team supervision and 24 hour management of active medical problems listed below. Physiatrist and rehab team continue to assess barriers to discharge/monitor patient progress toward functional and medical goals.  Function:  Bathing Bathing position   Position: Shower  Bathing parts Body parts bathed by patient: Left arm, Chest, Right upper leg,  Left upper leg, Abdomen, Front perineal area Body parts bathed by helper: Buttocks, Left lower leg, Right lower leg, Back  Bathing assist Assist Level: 2 helpers      Upper Body Dressing/Undressing Upper body dressing   What is the patient wearing?: Pull over shirt/dress     Pull over shirt/dress - Perfomed by patient: Thread/unthread right sleeve, Put head through opening, Pull shirt over trunk Pull over shirt/dress - Perfomed by helper: Thread/unthread left sleeve        Upper body assist Assist Level:  (total assist)      Lower Body Dressing/Undressing Lower body dressing   What is the patient wearing?: Pants, Ted Hose, Shoes     Pants- Performed by patient: Thread/unthread right pants leg Pants- Performed by helper: Thread/unthread left pants leg, Pull pants up/down   Non-skid slipper socks- Performed by helper: Don/doff right sock, Don/doff left sock     Shoes - Performed by patient: Don/doff right shoe Shoes - Performed by helper: Don/doff left shoe, Fasten right, Fasten left       TED Hose - Performed by helper: Don/doff right TED hose, Don/doff left TED hose  Lower body assist Assist for lower body dressing: 2 Helpers      Toileting Toileting     Toileting steps completed by helper: Adjust clothing prior to toileting, Performs perineal hygiene, Adjust clothing after toileting Toileting Assistive Devices: Grab bar or rail  Toileting assist Assist level: Two helpers   Transfers Chair/bed transfer   Chair/bed transfer method: Squat pivot Chair/bed transfer assist level: Moderate assist (Pt 50 -  74%/lift or lower) Chair/bed transfer assistive device: Sliding board Mechanical lift: Maximove   Locomotion Ambulation Ambulation activity did not occur: Safety/medical concerns   Max distance: 8 Assist level: 2 helpers   Wheelchair   Type: Manual (TIS )   Assist Level: Dependent (Pt equals 0%)  Cognition Comprehension Comprehension assist level: Understands  basic 50 - 74% of the time/ requires cueing 25 - 49% of the time  Expression Expression assist level: Expresses basic 50 - 74% of the time/requires cueing 25 - 49% of the time. Needs to repeat parts of sentences.  Social Interaction Social Interaction assist level: Interacts appropriately 25 - 49% of time - Needs frequent redirection.  Problem Solving Problem solving assist level: Solves basic 25 - 49% of the time - needs direction more than half the time to initiate, plan or complete simple activities  Memory Memory assist level: Recognizes or recalls 25 - 49% of the time/requires cueing 50 - 75% of the time   Medical Problem List and Plan: 1.  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  DVT Prophylaxis/Anticoagulation: Subcutaneous Lovenox.  -vascular studies are normal 3. Pain Management: Tylenol as needed  -gabapentin increased to 300mg  for neuropathic left-sided pain,  -continue at reduced BID dose 4. Tracheostomy: trach site healed 5. Neuropsych: This patient is not capable of making decisions on his own behalf. 6. Skin/Wound Care: Routine skin checks 7. Fluids/Electrolytes/Nutrition: labs improving 8. Dysphagia--now on D1/honey  -continue megace for appetite  -scheduled formula has been stopped  -continue with reduced flushes  -keep protein supp 9. ESBL Klebsiella in sputum culture. Contact precautions 10.  HCAP. Antibiotic therapy completed   Currently afebrile 11. Gout. 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 Neurogenic Bladder/bowel:   -Cont I/O cath as needed  -remains incontinent of stool 14. AKI  Cr back to 1.3 today  Colchicine held  Received IVF  Discussed with patient and family about importance of fluids  -advancement of liquids would be nice 15. Lethargy  -some improvement. ?med  related  -continue to monitor clinically   LOS (Days) 19 A FACE TO FACE EVALUATION WAS PERFORMED  SWARTZ,ZACHARY T 06/13/2016 9:20 AM

## 2016-06-14 ENCOUNTER — Inpatient Hospital Stay (HOSPITAL_COMMUNITY): Payer: Medicaid Other | Admitting: Physical Therapy

## 2016-06-14 NOTE — Progress Notes (Signed)
[place] PHYSICAL MEDICINE & REHABILITATION     PROGRESS NOTE    Subjective/Complaints: No new problems. Family at bedside. Patient slept well.  ROS: limited by cognition/language to an extent still.  Objective: Vital Signs: Blood pressure 112/73, pulse (!) 57, temperature 98.2 F (36.8 C), temperature source Oral, resp. rate 20, height 5\' 7"  (1.702 m), weight 75.2 kg (165 lb 12.8 oz), SpO2 100 %. No results found. No results for input(s): WBC, HGB, HCT, PLT in the last 72 hours.  Recent Labs  06/12/16 0718  NA 138  K 4.4  CL 108  GLUCOSE 93  BUN 27*  CREATININE 1.32*  CALCIUM 9.7   CBG (last 3)  No results for input(s): GLUCAP in the last 72 hours.  Wt Readings from Last 3 Encounters:  06/14/16 75.2 kg (165 lb 12.8 oz)  05/25/16 78.2 kg (172 lb 8 oz)    Physical Exam:  Gen: Well developed. NAD.  HENT: Normocephalic. Abrasions.  Eyes: EOMI in right eye. No discharge.  Neck: Trach stoma closed.  Cardiovascular: Normal rateand regular rhythm.  Respiratory: Effort normaland breath sounds normal. Good air movement. No wheezes or rales.  GI: Soft. Bowel sounds are normal. PEG tube remains intact Neurological: Dysconjugate gaze  Tracks with right eye.  Follows simple one step commands.  Left hemiparesis mAS 1+/4 wrist flexors LUE, Left hamstrings/gastrocs 1 to 1+/4  Musculoskeletal: No edema. No tenderness. Psych: denies being depressed   Assessment/Plan: 1. Functional deficits secondary to TBI which require 3+ hours per day of interdisciplinary therapy in a comprehensive inpatient rehab setting. Physiatrist is providing close team supervision and 24 hour management of active medical problems listed below. Physiatrist and rehab team continue to assess barriers to discharge/monitor patient progress toward functional and medical goals.  Function:  Bathing Bathing position   Position: Shower  Bathing parts Body parts bathed by patient: Left arm, Chest,  Right upper leg, Left upper leg, Abdomen, Front perineal area Body parts bathed by helper: Buttocks, Left lower leg, Right lower leg, Back  Bathing assist Assist Level: 2 helpers      Upper Body Dressing/Undressing Upper body dressing   What is the patient wearing?: Pull over shirt/dress     Pull over shirt/dress - Perfomed by patient: Thread/unthread right sleeve, Put head through opening, Pull shirt over trunk Pull over shirt/dress - Perfomed by helper: Thread/unthread left sleeve        Upper body assist Assist Level:  (total assist)      Lower Body Dressing/Undressing Lower body dressing   What is the patient wearing?: Pants, Ted Hose, Shoes     Pants- Performed by patient: Thread/unthread right pants leg Pants- Performed by helper: Thread/unthread left pants leg, Pull pants up/down   Non-skid slipper socks- Performed by helper: Don/doff right sock, Don/doff left sock     Shoes - Performed by patient: Don/doff right shoe Shoes - Performed by helper: Don/doff left shoe, Fasten right, Fasten left       TED Hose - Performed by helper: Don/doff right TED hose, Don/doff left TED hose  Lower body assist Assist for lower body dressing: 2 Helpers      Toileting Toileting     Toileting steps completed by helper: Adjust clothing prior to toileting, Performs perineal hygiene, Adjust clothing after toileting Toileting Assistive Devices: Grab bar or rail  Toileting assist Assist level: Two helpers   Transfers Chair/bed transfer   Chair/bed transfer method: Squat pivot Chair/bed transfer assist level: Moderate assist (Pt 50 -  74%/lift or lower) Chair/bed transfer assistive device: Sliding board Mechanical lift: Maximove   Locomotion Ambulation Ambulation activity did not occur: Safety/medical concerns   Max distance: 8 Assist level: 2 helpers   Wheelchair   Type: Manual (TIS )   Assist Level: Dependent (Pt equals 0%)  Cognition Comprehension Comprehension assist  level: Understands basic 50 - 74% of the time/ requires cueing 25 - 49% of the time  Expression Expression assist level: Expresses basic 50 - 74% of the time/requires cueing 25 - 49% of the time. Needs to repeat parts of sentences.  Social Interaction Social Interaction assist level: Interacts appropriately 25 - 49% of time - Needs frequent redirection.  Problem Solving Problem solving assist level: Solves basic 25 - 49% of the time - needs direction more than half the time to initiate, plan or complete simple activities  Memory Memory assist level: Recognizes or recalls 25 - 49% of the time/requires cueing 50 - 75% of the time   Medical Problem List and Plan: 1.  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  DVT Prophylaxis/Anticoagulation: Subcutaneous Lovenox.  -vascular studies are normal 3. Pain Management: Tylenol as needed  -gabapentin increased to 300mg  for neuropathic left-sided pain,    4. Tracheostomy: trach site healed 5. Neuropsych: This patient is not capable of making decisions on his own behalf. 6. Skin/Wound Care: Routine skin checks 7. Fluids/Electrolytes/Nutrition: labs improving 8. Dysphagia--now on D1/honey  -continue megace for appetite  -scheduled formula has been stopped  -continue with reduced flushes  -keep protein supp 9. ESBL Klebsiella in sputum culture. Contact precautions 10.  HCAP. Antibiotic therapy completed   Currently afebrile 11. Gout. 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 Neurogenic Bladder/bowel:   -Cont I/O cath as needed  -improving bowel and bladder continence 14. AKI  Cr back to 1.3 today  Colchicine held  Received IVF  -recheck labs tomorrow    LOS (Days) 20 A FACE TO FACE EVALUATION WAS PERFORMED  SWARTZ,ZACHARY T 06/14/2016 9:20 AM

## 2016-06-14 NOTE — Progress Notes (Signed)
Physical Therapy Session Note  Patient Details  Name: [name] CraneOmar Goehring MRN: [id number] Date of Birth: 06/10/1991  Today's Date: 06/14/2016 PT Individual Time: 1105-1200 PT Individual Time Calculation (min): 55 min    Short Term Goals: Week 3:  PT Short Term Goal 1 (Week 3): Patient will consistently perform functional transfers with moderate assistance  PT Short Term Goal 2 (Week 3): Patient will consistenly follow one step instructions 100% of the time.  PT Short Term Goal 3 (Week 3): Patient will propel wheelchair 25 feet with max assist.  PT Short Term Goal 4 (Week 3): Patient will stand with least restrictive assistive device for 90 seconds with max assistx1.   Skilled Therapeutic Interventions/Progress Updates:    Professional interpreter present for session.  Pt received in bed with brother present for session; pt agreeable to tx, denying c/o pain. Pt required multimodal cuing to initiate supine>sitting EOB transfer but then able to complete movement with min assist. Instructed pt to initiate tasks and then therapist would assist him with completing movement. Pt reported he anticipated his sister would move him & complete tasks for him once he d/c to her house; educated pt on importance to participating in tasks instead of anticipating his sister would do all of the work. Session focused on slide board transfers to various surfaces (bed>w/c, w/c<>low, soft couch, w/c<>nu-step, and w/c<>car) with +2 assist. Pt able to complete transfers bed>w/c and w/c<>nu-step with +1 max assist from therapist and supervision from rehab tech for increased safety, but pt required +2 assist to transfer w/c<>car & w/c<>couch due to uneven surfaces. Pt with great difficulty transferring to elevated surface. Throughout all transfers therapist provided max multimodal cuing for anterior weight shift, hand & foot placement, weight shifting of buttocks, blocking of L knee, and cuing to coordinate movement with therapist.  [facility] requested brother measure seat height of van. Pt utilized nu-step with all 4 extremities with LLE brace & hand support for forced use of limbs focusing on neuro re-ed & strengthening. Pt performed task x 7 minutes at level 2 with max cuing for technique. Educated pt's brother to not attempt to use bike/nu-step at home unless practiced with HHPT first. At end of session pt left sitting in w/c with QRB in place & brother present. Pt reported need to use restroom & NT made aware; educated pt's brother to not attempt to take him to bathroom.   Therapy Documentation Precautions:  Precautions Precautions: Fall Precaution Comments: PEG Restrictions Weight Bearing Restrictions: No   See Function Navigator for Current Functional Status.   Therapy/Group: Individual Therapy  Sandi MariscalVictoria M Elanda Garmany 06/14/2016, 12:39 PM

## 2016-06-15 ENCOUNTER — Inpatient Hospital Stay (HOSPITAL_COMMUNITY): Payer: Medicaid Other | Admitting: Occupational Therapy

## 2016-06-15 ENCOUNTER — Inpatient Hospital Stay (HOSPITAL_COMMUNITY): Payer: Self-pay | Admitting: Speech Pathology

## 2016-06-15 ENCOUNTER — Inpatient Hospital Stay (HOSPITAL_COMMUNITY): Payer: Medicaid Other | Admitting: Physical Therapy

## 2016-06-15 ENCOUNTER — Inpatient Hospital Stay (HOSPITAL_COMMUNITY): Payer: Medicaid Other | Admitting: Speech Pathology

## 2016-06-15 LAB — BASIC METABOLIC PANEL
ANION GAP: 9 (ref 5–15)
BUN: 32 mg/dL — AB (ref 6–20)
CO2: 21 mmol/L — ABNORMAL LOW (ref 22–32)
Calcium: 10 mg/dL (ref 8.9–10.3)
Chloride: 110 mmol/L (ref 101–111)
Creatinine, Ser: 1.39 mg/dL — ABNORMAL HIGH (ref 0.61–1.24)
GFR calc Af Amer: >60 mL/min (ref >60)
Glucose, Bld: 86 mg/dL (ref 65–99)
POTASSIUM: 4.3 mmol/L (ref 3.5–5.1)
SODIUM: 140 mmol/L (ref 135–145)

## 2016-06-15 LAB — CBC
HCT: 32 % — ABNORMAL LOW (ref 39.0–52.0)
Hemoglobin: 10.7 g/dL — ABNORMAL LOW (ref 13.0–17.0)
MCH: 30.7 pg (ref 26.0–34.0)
MCHC: 33.4 g/dL (ref 30.0–36.0)
MCV: 91.7 fL (ref 78.0–100.0)
PLATELETS: 208 10*3/uL (ref 150–400)
RBC: 3.49 MIL/uL — AB (ref 4.22–5.81)
RDW: 15.4 % (ref 11.5–15.5)
WBC: 7 10*3/uL (ref 4.0–10.5)

## 2016-06-15 MED ORDER — HYDROXYZINE HCL 10 MG PO TABS
5.0000 mg | ORAL_TABLET | Freq: Three times a day (TID) | ORAL | Status: DC | PRN
  Administered 2016-06-15: 5 mg via ORAL
  Filled 2016-06-15 (×2): qty 1

## 2016-06-15 NOTE — Progress Notes (Signed)
Speech Language Pathology Daily Session Note  Patient Details  Name: [place] CraneOmar Orchard MRN: [id number] Date of Birth: 05/17/1991  Today's Date: 06/15/2016 SLP Individual Time: 1105-1200 SLP Individual Time Calculation (min): 55 min   Short Term Goals: Week 3: SLP Short Term Goal 1 (Week 3): Patient will consume current diet with minimal overt s/s of aspiration with Mod A verbal and question cues.  SLP Short Term Goal 2 (Week 3): Patient will verbally answer questions in 75% of opportunties with Min A verbal cues.  SLP Short Term Goal 3 (Week 3): Patient will utilize speech intelligibility strategies at the phrase level with Min A verbal cues to achieve 90% intelligibility. SLP Short Term Goal 4 (Week 3): Patient will demonstrate selective attention to task in a mildly distracting enviornment for ~30 minutes with Min A verbal cues for redirection.  SLP Short Term Goal 5 (Week 3): Patient will demonstrate functional problem solving with basic and familiar tasks with Min A verbal and visual cues.  SLP Short Term Goal 6 (Week 3): Patient will orient to time, place and situation with Min A verbal cues.   Skilled Therapeutic Interventions:   Skilled treatment session focused on addressing cognition and swallow goals during family educations.  Mother, sister, and other family present throughout session; sister was the most hands on during session.  SLP facilitated session by providing instruction with teach back opportunities for thickening liquids to a honey-thick consistency, as well as current Dys.1 diet texture restrictions.  Sister and Kandis MannanOmar asked appropriate questions about how to prepare foods to recommended consistency.  SLP also demonstrated full supervision with Mod faded to Min cues for portion control and pacing due to family inconsistently reminding patient to use 2 swallows.  SLP also initiated education regarding home management strategies for cognition with focus on giving choices and allowing  extra time for patient to do and say for himself.  Recommend another session of family training so that they can return demonstration of techniques.      Function:  Eating Eating   Modified Consistency Diet: Yes Eating Assist Level: More than reasonable amount of time;Set up assist for;Supervision or verbal cues;Helper scoops food on utensil;Help managing cup/glass   Eating Set Up Assist For: Opening containers Helper Scoops Food on Utensil: Occasionally Helper Brings Food to Mouth: Occasionally   Cognition Comprehension Comprehension assist level: Understands basic 50 - 74% of the time/ requires cueing 25 - 49% of the time  Expression   Expression assist level: Expresses basic 50 - 74% of the time/requires cueing 25 - 49% of the time. Needs to repeat parts of sentences.  Social Interaction Social Interaction assist level: Interacts appropriately 25 - 49% of time - Needs frequent redirection.  Problem Solving Problem solving assist level: Solves basic 25 - 49% of the time - needs direction more than half the time to initiate, plan or complete simple activities  Memory Memory assist level: Recognizes or recalls 25 - 49% of the time/requires cueing 50 - 75% of the time    Pain Pain Assessment Pain Assessment: No/denies pain Faces Pain Scale: No hurt  Therapy/Group: Individual Therapy  Charlane FerrettiMelissa Emillia Weatherly, M.A., CCC-SLP [phone]  Hetvi Shawhan 06/15/2016, 1:03 PM

## 2016-06-15 NOTE — Progress Notes (Signed)
[place] PHYSICAL MEDICINE & REHABILITATION     PROGRESS NOTE    Subjective/Complaints: No new issues overnight.  ROS: limited by cognition/language to an extent still. Denies new pain  Objective: Vital Signs: Blood pressure 121/72, pulse 70, temperature 99.9 F (37.7 C), temperature source Oral, resp. rate 18, height 5\' 7"  (1.702 m), weight 74 kg (163 lb 2.3 oz), SpO2 100 %. No results found. No results for input(s): WBC, HGB, HCT, PLT in the last 72 hours. No results for input(s): NA, K, CL, GLUCOSE, BUN, CREATININE, CALCIUM in the last 72 hours.  Invalid input(s): CO CBG (last 3)  No results for input(s): GLUCAP in the last 72 hours.  Wt Readings from Last 3 Encounters:  06/15/16 74 kg (163 lb 2.3 oz)  05/25/16 78.2 kg (172 lb 8 oz)    Physical Exam:  Gen: Well developed. NAD.  HENT: Normocephalic. Abrasions.  Eyes: EOMI in right eye. No discharge.  Neck: Trach stoma closed.  Cardiovascular: Normal rateand regular rhythm.  Respiratory: Effort normaland breath sounds normal. Good air movement. No wheezes or rales.  GI: Soft. Bowel sounds are normal. PEG tube remains intact Neurological: Dysconjugate gaze  Tracks with right eye.  Follows simple one step commands.  Left hemiparesis mAS 1+/4 wrist flexors LUE, Left hamstrings/gastrocs 1 to 1+/4  Musculoskeletal: No edema. No tenderness. Psych: denies being depressed   Assessment/Plan: 1. Functional deficits secondary to TBI which require 3+ hours per day of interdisciplinary therapy in a comprehensive inpatient rehab setting. Physiatrist is providing close team supervision and 24 hour management of active medical problems listed below. Physiatrist and rehab team continue to assess barriers to discharge/monitor patient progress toward functional and medical goals.  Function:  Bathing Bathing position   Position: Shower  Bathing parts Body parts bathed by patient: Left arm, Chest, Right upper leg, Left upper  leg, Abdomen, Front perineal area Body parts bathed by helper: Buttocks, Left lower leg, Right lower leg, Back  Bathing assist Assist Level: 2 helpers      Upper Body Dressing/Undressing Upper body dressing   What is the patient wearing?: Pull over shirt/dress     Pull over shirt/dress - Perfomed by patient: Thread/unthread right sleeve, Put head through opening, Pull shirt over trunk Pull over shirt/dress - Perfomed by helper: Thread/unthread left sleeve        Upper body assist Assist Level:  (total assist)      Lower Body Dressing/Undressing Lower body dressing   What is the patient wearing?: Pants, Ted Hose, Shoes     Pants- Performed by patient: Thread/unthread right pants leg Pants- Performed by helper: Thread/unthread left pants leg, Pull pants up/down   Non-skid slipper socks- Performed by helper: Don/doff right sock, Don/doff left sock     Shoes - Performed by patient: Don/doff right shoe Shoes - Performed by helper: Don/doff left shoe, Fasten right, Fasten left       TED Hose - Performed by helper: Don/doff right TED hose, Don/doff left TED hose  Lower body assist Assist for lower body dressing: 2 Helpers      Toileting Toileting     Toileting steps completed by helper: Adjust clothing prior to toileting, Performs perineal hygiene, Adjust clothing after toileting Toileting Assistive Devices: Grab bar or rail  Toileting assist Assist level: Two helpers   Transfers Chair/bed transfer   Chair/bed transfer method: Lateral scoot Chair/bed transfer assist level: 2 helpers (max assist +1 with additional supervision assist for safety) Chair/bed transfer assistive device: Armrests,  Sliding board Mechanical lift: Maximove   Locomotion Ambulation Ambulation activity did not occur: Safety/medical concerns   Max distance: 8 Assist level: 2 helpers   Wheelchair   Type: Manual (TIS )   Assist Level: Dependent (Pt equals 0%)  Cognition Comprehension  Comprehension assist level: Understands basic 50 - 74% of the time/ requires cueing 25 - 49% of the time  Expression Expression assist level: Expresses basic 50 - 74% of the time/requires cueing 25 - 49% of the time. Needs to repeat parts of sentences.  Social Interaction Social Interaction assist level: Interacts appropriately 25 - 49% of time - Needs frequent redirection.  Problem Solving Problem solving assist level: Solves basic 25 - 49% of the time - needs direction more than half the time to initiate, plan or complete simple activities  Memory Memory assist level: Recognizes or recalls 25 - 49% of the time/requires cueing 50 - 75% of the time   Medical Problem List and Plan: 1.  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  DVT Prophylaxis/Anticoagulation: Subcutaneous Lovenox until dc  -vascular studies are normal 3. Pain Management: Tylenol as needed  -gabapentin increased to 300mg  for neuropathic left-sided pain, seems to be tolerating    4. Tracheostomy: trach site healed 5. Neuropsych: This patient is not capable of making decisions on his own behalf. 6. Skin/Wound Care: Routine skin checks 7. Fluids/Electrolytes/Nutrition: labs improving 8. Dysphagia--now on D1/honey  -continue megace for appetite  -scheduled formula has been stopped  -continue with reduced flushes  -would like to upgrade diet prior to d/c 9. ESBL Klebsiella in sputum culture. Contact precautions 10.  HCAP. Antibiotic therapy completed   Currently afebrile 11. Gout. 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 Neurogenic Bladder/bowel:   -Cont I/O cath as needed  -improving bowel and bladder continence 14. AKI  Cr back to 1.3 on Friday  Colchicine held  Received IVF  -labs pending for today    LOS (Days) 21 A FACE TO FACE  EVALUATION WAS PERFORMED  SWARTZ,ZACHARY T 06/15/2016 8:41 AM

## 2016-06-15 NOTE — Progress Notes (Addendum)
Physical Therapy Weekly Progress Note  Patient Details  Name: [name] [name] MRN: [id number] Date of Birth: February 08, 1991  Beginning of progress report period: June 09, 2016 End of progress report period: June 15, 2016  Today's Date: 06/15/2016 PT Individual Time: 1305-1420 and 1420-1430 PT Individual Time Calculation (min): 75 min and 10 min = 85 min total   Patient has met 1 of 4 short term goals.  Pt is making slow progress towards goals. Pt with slightly improved motivation and willingness to participate in functional tasks with max education & encouragement from therapist. Family members have been present during sessions providing encouragement with family beginning to participate in family education on this date. Pt currently utilizes a sliding board or either squat pivot for functional transfers, requiring max assist +1 or mod assist +2 from family for safety & transfers to elevated surfaces. Pt with slight quad activation in LLE but limited by LLE pain overall.   Patient continues to demonstrate the following deficits: decreased attention, decreased safety awareness, decreased problem solving, decreased strength, decreased endurance, decreased ability to follow commands, decreased balance, decreased ability to perform bed mobility & transfers, decreased w/c mobility and therefore will continue to benefit from skilled PT intervention to enhance overall performance with activity tolerance, balance, postural control, ability to compensate for deficits, functional use of  left lower extremity, attention, awareness, coordination, knowledge of precautions and pt/family education.  Patient progressing slowly towards long term goals.  See goal revision; goals downgraded from mod assist transfers to max assist and min assist w/c mobility to mod assist.  Continue plan of care.  PT Short Term Goals Week 3:  PT Short Term Goal 1 (Week 3): Patient will consistently perform functional transfers with  moderate assistance  PT Short Term Goal 1 - Progress (Week 3): Not met (max assist +1 with sliding board) PT Short Term Goal 2 (Week 3): Patient will consistenly follow one step instructions 100% of the time.  PT Short Term Goal 2 - Progress (Week 3): Progressing toward goal PT Short Term Goal 3 (Week 3): Patient will propel wheelchair 25 feet with max assist.  PT Short Term Goal 3 - Progress (Week 3): Met PT Short Term Goal 4 (Week 3): Patient will stand with least restrictive assistive device for 90 seconds with max assistx1. PT Short Term Goal 4 - Progress (Week 3): Progressing toward goal Week 4:  PT Short Term Goal 1 (Week 4): STG = LTG due to estimated d/c date.    Skilled Therapeutic Interventions/Progress Updates:    Patient received in w/c with family present for education & pt denying c/o pain. Session focused on squat pivot transfers (w/c<>couch, w/c<>car, w/c<>bed) with therapist providing demonstration first, education regarding sequencing, safety & technique to family. Pt able to complete all transfers with mod/max assist + 2 from sister [facility]) & mother. Pt requires cuing for anterior weight shift, hand placement and pivoting buttocks. Pt with greater difficulty transferring to elevated surfaces. Completed real car transfer multiple times with pt entering van behind driver's seat as he can utilize handrails to assist with transfer. Completed transfer with and without sliding board (modified stand pivot) with pt demonstrating increased ease without board. Pt's sister & mother were able to assist pt with w/c<>car transfer with minimal cuing from therapist. Pt requires cuing from therapist and/or sister during transfers for BLE placement & increased safety with task. Back in room pt's sister & mother assisted pt with squat pivot w/c>bed and sit>supine with +2 assist with  supervision from therapist. Pt participated in scooting to head of bed with family member positioning BLE, pt utilizing bed  rail to pull up & bed in trendelenburg position. At end of session pt left in bed with bed alarm set & family present.   Pt's sister reports they are installing the ramp and it should be completed prior to pt's d/c.   Therapy Documentation Precautions:  Precautions Precautions: Fall Precaution Comments: PEG Restrictions Weight Bearing Restrictions: No   See Function Navigator for Current Functional Status.  Therapy/Group: Individual Therapy  Waunita Schooner 06/15/2016, 5:57 PM

## 2016-06-15 NOTE — Plan of Care (Signed)
Problem: RH Bed to Chair Transfers Goal: LTG Patient will perform bed/chair transfers w/assist (PT) LTG: Patient will perform bed/chair transfers with assistance, with/without cues (PT).  Downgrade 2/2 slow progress  Problem: RH Car Transfers Goal: LTG Patient will perform car transfers with assist (PT) LTG: Patient will perform car transfers with assistance (PT).  Downgrade 2/2 slow progress  Problem: RH Wheelchair Mobility Goal: LTG Patient will propel w/c in controlled environment (PT) LTG: Patient will propel wheelchair in controlled environment, # of feet with assist (PT)  50 ft; downgrade 2/2 slow progress Goal: LTG Patient will propel w/c in home environment (PT) LTG: Patient will propel wheelchair in home environment, # of feet with assistance (PT).  25 ft; downgrade 2/2 slow pgoress

## 2016-06-15 NOTE — Progress Notes (Signed)
Occupational Therapy Session Note  Patient Details  Name: [name] CraneOmar Linn MRN: [id number] Date of Birth: 11/19/1990  Today's Date: 06/15/2016 OT Individual Time: 1000-1100 OT Individual Time Calculation (min): 60 min     Short Term Goals:Week 3:  OT Short Term Goal 1 (Week 3): STGs = LTGs      Skilled Therapeutic Interventions/Progress Updates:    OT session focused on family education with his sister, [name], mother, Tanda RockersLupe, and teen grandson. Pt and family performed extremely well today. Pt was highly motivated and actually requested to practice standing and transfers with each family member! Each family member observed OT demonstrate and then they return demonstrated: Rolling to L and pt pushing to sitting with L arm ([name] only) +2 mod A for squat pivot bed to W/c and w/c to tub bench [facility]([name] only for tub bench in tub room with clothing on) 1 person min A to stand in front of sink and at end of bed using bed rail (pt practiced 8x total without fatigue)   Family participated in facilitating his bathing (at sink) and dressing with education on one handed techniques, cues for initiation/ problem solving, and which tasks he can do and which he needs A with (ie washing R arm, buttocks, donning L shoe)  Family and pt very pleased with how session went and stated they are feeling much more comfortable.  Tomorrow with continue with education and practice with full shower using tub bench.    Therapy Documentation Precautions:  Precautions Precautions: Fall Precaution Comments: PEG Restrictions Weight Bearing Restrictions: No    Vital Signs: Therapy Vitals Temp: 99.9 F (37.7 C) Temp Source: Oral Pulse Rate: 70 Resp: 18 BP: 121/72 Patient Position (if appropriate): Sitting Oxygen Therapy SpO2: 100 % O2 Device: Not Delivered Pain: no c/o pain, but does have severe itching all over body   ADL: ADL ADL Comments: refer to functional navigator  See Function Navigator for Current  Functional Status.   Therapy/Group: Individual Therapy  SAGUIER,JULIA 06/15/2016, 8:41 AM

## 2016-06-16 ENCOUNTER — Inpatient Hospital Stay (HOSPITAL_COMMUNITY): Payer: Medicaid Other | Admitting: Speech Pathology

## 2016-06-16 ENCOUNTER — Inpatient Hospital Stay (HOSPITAL_COMMUNITY): Payer: Medicaid Other | Admitting: Occupational Therapy

## 2016-06-16 ENCOUNTER — Inpatient Hospital Stay (HOSPITAL_COMMUNITY): Payer: Medicaid Other | Admitting: Physical Therapy

## 2016-06-16 NOTE — Progress Notes (Signed)
Occupational Therapy Session Note  Patient Details  Name: [name] [name] MRN: [id number] Date of Birth: 03/23/1991  Today's Date: 06/16/2016 OT Individual Time: 1100-1200 OT Individual Time Calculation (min): 60 min     Short Term Goals:Week 3:  OT Short Term Goal 1 (Week 3): STGs = LTGs  Skilled Therapeutic Interventions/Progress Updates:    Pt seen for continuation of ADL training and transfers with family education with pt's mom, sister, and family friend who will be actively involved in his care.  Demonstration by OT with repeat demonstration with all 3 family members: Bed mobility Bed >< w/c squat pivot W/c >< BSC over toilet squat pivot Sit to stand from toilet W/c >< tub bench squat pivot Sit to stand in shower with bar Showering techniques with long sponge Dressing techniques from w/c level with hemidressing strategies. Sit to stand from w/c using R hand on bed rail  Overall, pt participated well and family demonstrated good technique.  The sister and friend stated they are very comfortable with helping him at home and demonstrated competence. His mother will need more practice. She will be here tomorrow for more training with pt's niece.  Pt initiating well and smiled at the end of the session.  Therapy Documentation Precautions:  Precautions Precautions: Fall Precaution Comments: PEG Restrictions Weight Bearing Restrictions: No      Pain: Pain Assessment Pain Assessment: No/denies pain ADL: ADL ADL Comments: refer to functional navigator   See Function Navigator for Current Functional Status.   Therapy/Group: Individual Therapy  SAGUIER,JULIA 06/16/2016, 12:25 PM

## 2016-06-16 NOTE — Progress Notes (Signed)
Speech Language Pathology Daily Session Note  Patient Details  Name: [name] [name] MRN: [id number] Date of Birth: 06/30/1991  Today's Date: 06/16/2016 SLP Individual Time: 1000-1100 SLP Individual Time Calculation (min): 60 min   Short Term Goals: Week 3: SLP Short Term Goal 1 (Week 3): Patient will consume current diet with minimal overt s/s of aspiration with Mod A verbal and question cues.  SLP Short Term Goal 2 (Week 3): Patient will verbally answer questions in 75% of opportunties with Min A verbal cues.  SLP Short Term Goal 3 (Week 3): Patient will utilize speech intelligibility strategies at the phrase level with Min A verbal cues to achieve 90% intelligibility. SLP Short Term Goal 4 (Week 3): Patient will demonstrate selective attention to task in a mildly distracting enviornment for ~30 minutes with Min A verbal cues for redirection.  SLP Short Term Goal 5 (Week 3): Patient will demonstrate functional problem solving with basic and familiar tasks with Min A verbal and visual cues.  SLP Short Term Goal 6 (Week 3): Patient will orient to time, place and situation with Min A verbal cues.   Skilled Therapeutic Interventions:Skilled therapy intervention focused on dysphagia and cognitive goals. Pt tolerated 6 oz of thin water via teaspoon and cup sip without overt s/s aspiration. Pt with appearance of improved pharyngeal delay. Will repeat objective measure of swallow to insure appropriate diet. Pt's sister [name] demonstrated ability to thicken liquids appropriately. Pt required consistent verbal cueing to elicit sentence level responses from patient and max A to reinforce swallow precautions due to impulsivity.    Function:  Eating Eating   Modified Consistency Diet: Yes Eating Assist Level: More than reasonable amount of time;Set up assist for;Supervision or verbal cues;Helper scoops food on utensil;Help managing cup/glass   Eating Set Up Assist For: Opening containers Helper Scoops  Food on Utensil: Occasionally     Cognition Comprehension Comprehension assist level: Understands basic 50 - 74% of the time/ requires cueing 25 - 49% of the time  Expression   Expression assist level: Expresses basic 50 - 74% of the time/requires cueing 25 - 49% of the time. Needs to repeat parts of sentences.  Social Interaction Social Interaction assist level: Interacts appropriately 25 - 49% of time - Needs frequent redirection.  Problem Solving Problem solving assist level: Solves basic 25 - 49% of the time - needs direction more than half the time to initiate, plan or complete simple activities  Memory Memory assist level: Recognizes or recalls 25 - 49% of the time/requires cueing 50 - 75% of the time    Pain Pain Assessment Pain Assessment: No/denies pain  Therapy/Group: Individual Therapy  Rocky CraftsKara E Wiatt Mahabir MA, CCC-SLP 06/16/2016, 1:00 PM

## 2016-06-16 NOTE — Progress Notes (Signed)
Physical Therapy Session Note  Patient Details  Name: [name] [name] MRN: [id number] Date of Birth: 11/24/1990  Today's Date: 06/16/2016 PT Individual Time: [id number] and [id number] PT Individual Time Calculation (min): 76 min and 10 min = 86 min total   Short Term Goals: Week 4:  PT Short Term Goal 1 (Week 4): STG = LTG due to estimated d/c date.   Skilled Therapeutic Interventions/Progress Updates:    Pt received in w/c with family present (sister [name] Nasuti(Mena), mother, and friend) for family education. Pt denied c/o pain at rest. Session focused on various transfers (w/c<>sofa, w/c<>car, w/c<>toilet) with therapist providing demonstration to friend and cuing for increased anterior weight shifting, hand placement, pt & caregiver positioning, and coordinating movements. Pt completed all transfers via squat pivot with mod assist +2 from family members. Caregivers also required cuing for blocking LLE to prevent buckling. Car transfer completed at simulated van seat height with cuing for positioning of BLE and safety with transfer due to elevated height of van seat. Pt's friend exited session & pt's sister requested to perform toilet transfer. Therapist provided demonstration, cuing pt for hand placement on armrests of 3-in-1 BSC armrest, education regarding pt's family member donning/doffing pants after pt completed sit>squat portion and then assisting with pivoting buttocks. Pt's sister & mother return demonstrated task. Pt's caregivers still require cuing for locking w/c brakes to increase safety with transfers. Pt requested juice to drink & required max multimodal cuing, occasionally hand over hand, to take small sips with 2 swallows between each sip; pt with fair demo of this. At end of session pt left sitting in w/c with QRB in place & family present to supervise.  During session pt propelled w/c x 125 ft with R hemi technique and mod assist; pt required max cuing to utilize RLE to steer w/c and mod assist  to maintain straight trajectory. Educated pt & family members on pt's L inattention with tasks, as well as need to give simple commands & allow pt extra processing time.   Therapy Documentation Precautions:  Precautions Precautions: Fall Precaution Comments: PEG Restrictions Weight Bearing Restrictions: No   See Function Navigator for Current Functional Status.   Therapy/Group: Individual Therapy  Sandi MariscalVictoria M Tonnie Stillman 06/16/2016, 5:39 PM

## 2016-06-16 NOTE — Progress Notes (Signed)
[facility]     PROGRESS NOTE    Subjective/Complaints: Up in bed. Left leg still tight. Pain perhaps a little better. Was able to sleep  ROS: limited by cognition/language to an extent still. Denies new pain  Objective: Vital Signs: Blood pressure 119/68, pulse 74, temperature 98 F (36.7 C), temperature source Oral, resp. rate 16, height 5\' 7"  (1.702 m), weight 74 kg (163 lb 2.3 oz), SpO2 98 %. No results found.  Recent Labs  06/15/16 0826  WBC 7.0  HGB 10.7*  HCT 32.0*  PLT 208    Recent Labs  06/15/16 0826  NA 140  K 4.3  CL 110  GLUCOSE 86  BUN 32*  CREATININE 1.39*  CALCIUM 10.0   CBG (last 3)  No results for input(s): GLUCAP in the last 72 hours.  Wt Readings from Last 3 Encounters:  06/15/16 74 kg (163 lb 2.3 oz)  05/25/16 78.2 kg (172 lb 8 oz)    Physical Exam:  Gen: Well developed. NAD.  HENT: Normocephalic. Abrasions.  Eyes: EOMI in right eye. No discharge.  Neck: Trach stoma closed.  Cardiovascular: Normal rateand regular rhythm.  Respiratory: Effort normaland breath sounds normal. Good air movement. No wheezes or rales.  GI: Soft. Bowel sounds are normal. PEG tube remains intact Neurological: Dysconjugate gaze  Tracks with right eye.  Follows simple one step commands.  Left hemiparesis mAS 1+/4 wrist flexors LUE, Left hamstrings/gastrocs 1 to 1+/4  Musculoskeletal: No edema. No tenderness. Psych: denies being depressed   Assessment/Plan: 1. Functional deficits secondary to TBI which require 3+ hours per day of interdisciplinary therapy in a comprehensive inpatient rehab setting. Physiatrist is providing close team supervision and 24 hour management of active medical problems listed below. Physiatrist and rehab team continue to assess barriers to discharge/monitor patient progress toward functional and medical goals.  Function:  Bathing Bathing position   Position: Shower  Bathing parts Body parts  bathed by patient: Left arm, Chest, Right upper leg, Left upper leg, Abdomen, Front perineal area Body parts bathed by helper: Buttocks, Left lower leg, Right lower leg, Back  Bathing assist Assist Level: 2 helpers      Upper Body Dressing/Undressing Upper body dressing   What is the patient wearing?: Pull over shirt/dress     Pull over shirt/dress - Perfomed by patient: Thread/unthread right sleeve, Put head through opening, Pull shirt over trunk Pull over shirt/dress - Perfomed by helper: Thread/unthread left sleeve        Upper body assist Assist Level:  (total assist)      Lower Body Dressing/Undressing Lower body dressing   What is the patient wearing?: Pants, Ted Hose, Shoes     Pants- Performed by patient: Thread/unthread right pants leg Pants- Performed by helper: Thread/unthread left pants leg, Pull pants up/down   Non-skid slipper socks- Performed by helper: Don/doff right sock, Don/doff left sock     Shoes - Performed by patient: Don/doff right shoe Shoes - Performed by helper: Don/doff left shoe, Fasten right, Fasten left       TED Hose - Performed by helper: Don/doff right TED hose, Don/doff left TED hose  Lower body assist Assist for lower body dressing: 2 Helpers      Toileting Toileting     Toileting steps completed by helper: Adjust clothing prior to toileting, Performs perineal hygiene, Adjust clothing after toileting Toileting Assistive Devices: Grab bar or rail  Toileting assist Assist level: Two helpers   Transfers Chair/bed  transfer   Chair/bed transfer method: Squat pivot Chair/bed transfer assist level: 2 helpers Chair/bed transfer assistive device: Armrests, Sliding board Mechanical lift: Maximove   Locomotion Ambulation Ambulation activity did not occur: Safety/medical concerns   Max distance: 8 Assist level: 2 helpers   Wheelchair   Type: Manual (TIS )   Assist Level: Dependent (Pt equals 0%)  Cognition Comprehension  Comprehension assist level: Understands basic 50 - 74% of the time/ requires cueing 25 - 49% of the time  Expression Expression assist level: Expresses basic 50 - 74% of the time/requires cueing 25 - 49% of the time. Needs to repeat parts of sentences.  Social Interaction Social Interaction assist level: Interacts appropriately 25 - 49% of time - Needs frequent redirection.  Problem Solving Problem solving assist level: Solves basic 25 - 49% of the time - needs direction more than half the time to initiate, plan or complete simple activities  Memory Memory assist level: Recognizes or recalls 25 - 49% of the time/requires cueing 50 - 75% of the time   Medical Problem List and Plan: 1.  Weakness, left hemiparesthesia secondary to TBI 03/02/2016 complicated by sepsis and respiratory failure             -continue therapies---CIR PT, OT, speech continue   -team conference today    2.  DVT Prophylaxis/Anticoagulation: Subcutaneous Lovenox until dc  -vascular studies are normal 3. Pain Management: Tylenol as needed  -gabapentin increased to 300mg  for neuropathic left-sided pain, seems to be tolerating    4. Tracheostomy: trach site healed 5. Neuropsych: This patient is not capable of making decisions on his own behalf. 6. Skin/Wound Care: Routine skin checks 7. Fluids/Electrolytes/Nutrition: labs improving 8. Dysphagia--now on D1/honey  -continue megace for appetite until dc  -continue with reduced flushes  -would like to upgrade diet prior to d/c  -family will really need to stay on top of fluid intake 9. ESBL Klebsiella in sputum culture. Contact precautions 10.  HCAP. Antibiotic therapy completed   Currently afebrile 11. Gout. 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 Neurogenic Bladder/bowel:   -Cont I/O cath as needed  -improving bowel and bladder continence 14. AKI  Cr essentially stable at 1.39 yesterday  Colchicine held  Received  IVF       LOS (Days) 22 A FACE TO FACE EVALUATION WAS PERFORMED  Steph Cheadle T 06/16/2016 9:05 AM

## 2016-06-17 ENCOUNTER — Inpatient Hospital Stay (HOSPITAL_COMMUNITY): Payer: Medicaid Other | Admitting: Physical Therapy

## 2016-06-17 ENCOUNTER — Inpatient Hospital Stay (HOSPITAL_COMMUNITY): Payer: Medicaid Other | Admitting: Speech Pathology

## 2016-06-17 ENCOUNTER — Inpatient Hospital Stay (HOSPITAL_COMMUNITY): Payer: Medicaid Other | Admitting: Occupational Therapy

## 2016-06-17 ENCOUNTER — Inpatient Hospital Stay (HOSPITAL_COMMUNITY): Payer: Medicaid Other

## 2016-06-17 LAB — BASIC METABOLIC PANEL
Anion gap: 7 (ref 5–15)
BUN: 29 mg/dL — AB (ref 6–20)
CALCIUM: 10 mg/dL (ref 8.9–10.3)
CO2: 24 mmol/L (ref 22–32)
CREATININE: 1.39 mg/dL — AB (ref 0.61–1.24)
Chloride: 108 mmol/L (ref 101–111)
GFR calc Af Amer: >60 mL/min (ref >60)
Glucose, Bld: 89 mg/dL (ref 65–99)
POTASSIUM: 4.2 mmol/L (ref 3.5–5.1)
SODIUM: 139 mmol/L (ref 135–145)

## 2016-06-17 NOTE — Progress Notes (Signed)
MBSS complete. Full report located under chart review in imaging section. Recommend: Dys 3 with thin. Staff/family to load utensil and manage cup as pt is unable to self-monitor bite-size and rate for safety precautions. Use straw with liquids to facilitate chin tuck with all liquids. Meds whole with puree.   Claudell KyleKara Seldon Barrell MA, CCC-SLP

## 2016-06-17 NOTE — Progress Notes (Signed)
Occupational Therapy Session Note  Patient Details  Name: [name] [name] MRN: [id number] Date of Birth: 07/11/1991  Today's Date: 06/17/2016 OT Individual Time: 1100-1205 OT Individual Time Calculation (min): 65 min     Short Term Goals:Week 3:  OT Short Term Goal 1 (Week 3): STGs = LTGs  Skilled Therapeutic Interventions/Progress Updates:    Pt very lethargic today with constant cues to stay alert for therapy. Continued family education with pt's mother and family friend who was here yesterday. Pt had shower yesterday, and will take another one tomorrow on graduation day.   Demonstration with repeat demonstration with mother and friend: Hemi dressing techniques Sit to stand from w/c with R hand on bed rail for LB dressing  In tub room set up to simulate their home bathroom: W/c >< toilet Sit to stand from toilet (max cues to put in full effort) Review of techniques for caregivers to assist with clothing management and clean up  W/c to toilet to tub bench Tub bench transfers Sit to stand in tub with grab bar (reviewed placement recommendations for grab bar) Bench to toilet to w/c  At kitchen table in ADL apt: Reviewed L arm placement on table for PROM, tapping facilitation over biceps, and A/arom for biceps, shoulder, finger flexors. Encouraged family to practice each exercise for 5 minutes 3 x a day before or after meals.  Pt's family took pt back to his room.  Therapy Documentation Precautions:  Precautions Precautions: Fall Precaution Comments: PEG Restrictions Weight Bearing Restrictions: No      Pain: Pain Assessment Pain Assessment: No/denies pain ADL: ADL ADL Comments: refer to functional navigator  See Function Navigator for Current Functional Status.   Therapy/Group: Individual Therapy  SAGUIER,JULIA 06/17/2016, 12:46 PM

## 2016-06-17 NOTE — Progress Notes (Signed)
Patient A/O, no noted distress or pain. Patient looks exceptional well. He appears to understand and follow instructions better. Family member was educated on tube meds administration and flushes. In case patient needs to go home with Peg, family is ready for continuous education. She has the proper techniques. She donned gloves, held tube properly and administered meds and flushes. Staff will continue to assist family with mastering the tasks resulted in patient may go home with tube. Patient was bathed and linen changed. Patient noted to have incont episodes.  Writer removed excessive supplies in room. Staff will continue to meet needs and monitor.

## 2016-06-17 NOTE — Progress Notes (Signed)
[facility] RE[facility]IL[facility]     PROGRESS NOTE    Subjective/Complaints: No new issues. More interactive with staff. Family ed continues  ROS: limited by cognition/language to an extent still. Denies new pain  Objective: Vital Signs: Blood pressure 132/75, pulse 64, temperature 98.7 F (37.1 C), temperature source Oral, resp. rate 16, height 5\' 7"  (1.702 m), weight 76.7 kg (169 lb 1.5 oz), SpO2 100 %. No results found.  Recent Labs  06/15/16 0826  WBC 7.0  HGB 10.7*  HCT 32.0*  PLT 208    Recent Labs  06/15/16 0826  NA 140  K 4.3  CL 110  GLUCOSE 86  BUN 32*  CREATININE 1.39*  CALCIUM 10.0   CBG (last 3)  No results for input(s): GLUCAP in the last 72 hours.  Wt Readings from Last 3 Encounters:  06/17/16 76.7 kg (169 lb 1.5 oz)  05/25/16 78.2 kg (172 lb 8 oz)    Physical Exam:  Gen: Well developed. NAD.  HENT: Normocephalic. Abrasions.  Eyes: EOMI in right eye. No discharge.  Neck: Trach stoma closed.  Cardiovascular: Normal rateand regular rhythm.  Respiratory: Effort normaland breath sounds normal. Good air movement. No wheezes or rales.  GI: Soft. Bowel sounds are normal. PEG tube remains intact Neurological: Dysconjugate gaze  Tracks with right eye.  Follows simple one step commands.  Left hemiparesis mAS 1+/4 wrist flexors LUE, Left hamstrings/gastrocs   1+/4  Musculoskeletal: No edema. No tenderness. Psych: denies being depressed   Assessment/Plan: 1. Functional deficits secondary to TBI which require 3+ hours per day of interdisciplinary therapy in a comprehensive inpatient rehab setting. Physiatrist is providing close team supervision and 24 hour management of active medical problems listed below. Physiatrist and rehab team continue to assess barriers to discharge/monitor patient progress toward functional and medical goals.  Function:  Bathing Bathing position   Position: Shower  Bathing parts Body parts bathed by  patient: Left arm, Chest, Right upper leg, Left upper leg, Abdomen, Front perineal area Body parts bathed by helper: Buttocks, Left lower leg, Right lower leg, Back  Bathing assist Assist Level: 2 helpers      Upper Body Dressing/Undressing Upper body dressing   What is the patient wearing?: Pull over shirt/dress     Pull over shirt/dress - Perfomed by patient: Thread/unthread right sleeve, Put head through opening, Pull shirt over trunk Pull over shirt/dress - Perfomed by helper: Thread/unthread left sleeve        Upper body assist Assist Level:  (total assist)      Lower Body Dressing/Undressing Lower body dressing   What is the patient wearing?: Pants, Ted Hose, Shoes     Pants- Performed by patient: Thread/unthread right pants leg Pants- Performed by helper: Thread/unthread left pants leg, Pull pants up/down   Non-skid slipper socks- Performed by helper: Don/doff right sock, Don/doff left sock     Shoes - Performed by patient: Don/doff right shoe Shoes - Performed by helper: Don/doff left shoe, Fasten right, Fasten left       TED Hose - Performed by helper: Don/doff right TED hose, Don/doff left TED hose  Lower body assist Assist for lower body dressing: 2 Helpers      Toileting Toileting     Toileting steps completed by helper: Adjust clothing prior to toileting, Performs perineal hygiene, Adjust clothing after toileting Toileting Assistive Devices: Grab bar or rail  Toileting assist Assist level: Two helpers   Transfers Chair/bed transfer   Chair/bed transfer method:  Squat pivot Chair/bed transfer assist level: 2 helpers Chair/bed transfer assistive device: Armrests, Sliding board Mechanical lift: Maximove   Locomotion Ambulation Ambulation activity did not occur: Safety/medical concerns   Max distance: 8 Assist level: 2 helpers   Wheelchair   Type: Manual Max wheelchair distance: 125 ft Assist Level: Moderate assistance (Pt 50 - 74%)   Cognition Comprehension Comprehension assist level: Understands basic 50 - 74% of the time/ requires cueing 25 - 49% of the time  Expression Expression assist level: Expresses basic 50 - 74% of the time/requires cueing 25 - 49% of the time. Needs to repeat parts of sentences.  Social Interaction Social Interaction assist level: Interacts appropriately 25 - 49% of time - Needs frequent redirection.  Problem Solving Problem solving assist level: Solves basic 25 - 49% of the time - needs direction more than half the time to initiate, plan or complete simple activities  Memory Memory assist level: Recognizes or recalls 25 - 49% of the time/requires cueing 50 - 75% of the time   Medical Problem List and Plan: 1.  Weakness, left hemiparesthesia secondary to TBI 03/02/2016 complicated by sepsis and respiratory failure             -continue therapies---CIR PT, OT, speech continue   -     2.  DVT Prophylaxis/Anticoagulation: Subcutaneous Lovenox until dc  -vascular studies are normal 3. Pain Management: Tylenol as needed  -gabapentin increased to 300mg  for neuropathic left-sided pain, seems to be tolerating    4. Tracheostomy: trach site healed 5. Neuropsych: This patient is not capable of making decisions on his own behalf. 6. Skin/Wound Care: Routine skin checks 7. Fluids/Electrolytes/Nutrition: labs improving 8. Dysphagia--now on D1/honey  -continue megace for appetite until dc  -continue with reduced flushes  -for MBS today---hopeful that he will be upgraded  -family will really need to stay on top of fluid intake 9. ESBL Klebsiella in sputum culture. Continue contact precautions 10.  HCAP. Antibiotic therapy completed   Currently afebrile 11. Gout. 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 Neurogenic Bladder/bowel:   -Cont I/O cath as needed  -improving bowel and bladder continence but still regularly incontinent 14. AKI  Cr essentially  stable at 1.39 yesterday  Colchicine held  -recheck BMET tomorrow       LOS (Days) 23 A FACE TO FACE EVALUATION WAS PERFORMED  [name] [name] T 06/17/2016 9:00 AM

## 2016-06-17 NOTE — Progress Notes (Signed)
Physical Therapy Session Note  Patient Details  Name: [name] [name] MRN: [id number] Date of Birth: 02/12/1991  Today's Date: 06/17/2016 PT Individual Time: [id number] PT Individual Time Calculation (min): 75 min    Short Term Goals: Week 4:  PT Short Term Goal 1 (Week 4): STG = LTG due to estimated d/c date.   Skilled Therapeutic Interventions/Progress Updates:    Professional interpreter present for session.  Pt received in w/c with mother & family friend [name] Ross(Beltran) present for family education/caregiver training. Utilized dynavision from w/c level for increased attention to L side; pt with slightly delayed reaction time to L upper & lower quadrants but with sustained attention to task x 2 minutes with supervision in quiet, controlled environment. Performed car transfer from [employer] simulated height with [name] RossBeltran & mother completing transfer with mod/max assist + 2. Pt required max cuing for anterior weight shift from therapist & caregivers. Educated caregivers on w/c parts management as well as technique (scooting pt's buttocks back onto seat before transferring BLE in/out of car) with caregivers return demonstrating. Emphasized importance of anterior weight shift & educated caregivers on head/hips relationship. Pt very fatigued during session with minimal participation in functional tasks. Pt returned to w/c & reported need to use urinal. Transported pt to room & mother assisted with urinal but pt unable to void. Therapist assisted pt with squat pivot w/c>bed with mother assisting with pivoting pt. Pt required max cuing to sit up to allow therapist to help him scoot to head of bed but pt transferred trunk to lying on bed. Pt required total assist to transfer BLE onto bed and min assist +2 with bed in trendelenburg & therapist supporting RLE for pt to scoot to head of bed. Pt falling asleep in bed and missed 15 minutes of skilled PT treatment. Pt left in bed with alarm set & mother present to supervise.  Pt's  family would benefit from continued practice with leading pt care with car transfer with supervision & feedback from therapist.   Therapy Documentation Precautions:  Precautions Precautions: Fall Precaution Comments: PEG Restrictions Weight Bearing Restrictions: No   General: PT Amount of Missed Time (min): 15 Minutes PT Missed Treatment Reason: Patient unwilling to participate (pt fatigue)   See Function Navigator for Current Functional Status.   Therapy/Group: Individual Therapy  [name] [name] 06/17/2016, 2:30 PM

## 2016-06-18 ENCOUNTER — Inpatient Hospital Stay (HOSPITAL_COMMUNITY): Payer: Self-pay | Admitting: Occupational Therapy

## 2016-06-18 ENCOUNTER — Inpatient Hospital Stay (HOSPITAL_COMMUNITY): Payer: Self-pay | Admitting: Physical Therapy

## 2016-06-18 ENCOUNTER — Inpatient Hospital Stay (HOSPITAL_COMMUNITY): Payer: Medicaid Other | Admitting: Speech Pathology

## 2016-06-18 ENCOUNTER — Inpatient Hospital Stay (HOSPITAL_COMMUNITY): Payer: Medicaid Other | Admitting: Occupational Therapy

## 2016-06-18 MED ORDER — BACLOFEN 10 MG PO TABS
10.0000 mg | ORAL_TABLET | Freq: Three times a day (TID) | ORAL | Status: DC
  Administered 2016-06-18 (×2): 10 mg via ORAL
  Filled 2016-06-18 (×2): qty 1

## 2016-06-18 MED ORDER — GABAPENTIN 250 MG/5ML PO SOLN
300.0000 mg | Freq: Two times a day (BID) | ORAL | Status: DC
  Administered 2016-06-18: 300 mg via ORAL
  Filled 2016-06-18 (×2): qty 6

## 2016-06-18 MED ORDER — PANTOPRAZOLE SODIUM 40 MG PO TBEC
40.0000 mg | DELAYED_RELEASE_TABLET | Freq: Every day | ORAL | Status: DC
  Administered 2016-06-18: 40 mg via ORAL
  Filled 2016-06-18: qty 1

## 2016-06-18 MED ORDER — FREE WATER: 50.0000 mL | Freq: Every day | Status: DC

## 2016-06-18 MED ORDER — GABAPENTIN 300 MG PO CAPS
300.0000 mg | ORAL_CAPSULE | Freq: Once | ORAL | Status: AC
  Administered 2016-06-18: 300 mg via ORAL
  Filled 2016-06-18: qty 1

## 2016-06-18 MED ORDER — PRO-STAT SUGAR FREE PO LIQD
30.0000 mL | Freq: Two times a day (BID) | ORAL | Status: DC
  Filled 2016-06-18: qty 30
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
## 2016-06-18 NOTE — Progress Notes (Signed)
Occupational Therapy Discharge Summary  Patient Details  Name: [name] [name] MRN: [id number] Date of Birth: 12/19/1990   Patient has met 7 of 7 long term goals due to improved activity tolerance, improved balance, postural control, ability to compensate for deficits, improved attention and improved awareness.  Patient to discharge at overall Mod Assist level with 2 caregivers for safety.  Patient's care partner is independent to provide the necessary physical and cognitive assistance at discharge.   On admission, pt required total A of 2 helpers for all skills and he had a trach, continuous feeding through G tube, and PICC line. On discharge, he is free of all tubes, can actively participate with family with transfers and self care which has dramatically decreased the burden of care on his family.    Reasons goals not met: For LB dressing he had a goal of mod A.  He really needs max A but the burden of care is light as he can now stand with mod A. This level of care is adequate for discharge as pt is able to care for pt.  Recommendation:  Patient will benefit from ongoing skilled OT services in home health setting to continue to advance functional skills in the area of BADL and Reduce care partner burden.  Equipment: transfer tub bench, drop arm BSC  Reasons for discharge: treatment goals met  Patient/family agrees with progress made and goals achieved: Yes  OT Discharge ADL ADL ADL Comments: refer to functional navigator Vision/Perception  Vision- Assessment Eye Alignment: Impaired (comment) (L eye deviation) Ocular Range of Motion: Other (comment) (L eye lags ) Alignment/Gaze Preference: Gaze right Tracking/Visual Pursuits: Unable to hold eye position out of midline;Other (comment) Visual Fields: Left visual field deficit Praxis Praxis-Other Comments: pt's praxis has improved a great deal. continues to need mod cues to initiation some tasks. but often does well with familiar  functional tasks  Cognition Overall Cognitive Status: Impaired/Different from baseline Orientation Level: Oriented to person;Oriented to place;Oriented to situation;Oriented to time Attention: Sustained;Selective Sustained Attention: Appears intact Selective Attention: Impaired Selective Attention Impairment: Functional basic Sensation Sensation Light Touch: Impaired by gross assessment Stereognosis: Impaired by gross assessment Hot/Cold: Appears Intact Proprioception: Impaired by gross assessment Additional Comments: L UE/LE Coordination Gross Motor Movements are Fluid and Coordinated: No Fine Motor Movements are Fluid and Coordinated: No Coordination and Movement Description: Lt hemiplegia Motor  Motor Motor: Hemiplegia;Abnormal tone Motor - Discharge Observations: mod hypertone in LUE Mobility    refer to functional navigator Trunk/Postural Assessment  Postural Control Righting Reactions: delayed Protective Responses: delayed  Balance Static Sitting Balance Static Sitting - Level of Assistance: 5: Stand by assistance Dynamic Sitting Balance Dynamic Sitting - Level of Assistance: 4: Min assist Static Standing Balance Static Standing - Level of Assistance: 3: Mod assist Dynamic Standing Balance Dynamic Standing - Level of Assistance: 2: Max assist Extremity/Trunk Assessment RUE Assessment RUE Assessment: Within Functional Limits LUE Assessment LUE Assessment: Exceptions to WFL (hypertonic, slight active finger flexion and elbow flexion)   See Function Navigator for Current Functional Status.  [place] 06/18/2016, 1:06 PM

## 2016-06-18 NOTE — Progress Notes (Signed)
Social Work  Discharge Note  The overall goal for the admission was met for:   Discharge location: Yes - home with family in [place]  Length of Stay: Yes - 15 days (with 11/3 discharge)  Discharge activity level: Yes - min to max assist overall  Home/community participation: Yes  Services provided included: MD, RD, PT, OT, SLP, RN, TR, Pharmacy and [place]: Medicaid application still pending at time of d/c  Follow-up services arranged: None - see prior SW progress note for details  Comments (or additional information):  Patient/Family verbalized understanding of follow-up arrangements: Yes  Individual responsible for coordination of the follow-up plan: sister  Confirmed correct DME delivered: [name] [name] 06/18/2016    Kourtni Stineman

## 2016-06-18 NOTE — Progress Notes (Signed)
Occupational Therapy Session Note  Patient Details  Name: [name] [name] MRN: [id number] Date of Birth: 02/13/1991  Today's Date: 06/18/2016 OT Individual Time: 1400-1430 OT Individual Time Calculation (min): 30 min     Short Term Goals:Week 3:  OT Short Term Goal 1 (Week 3): STGs = LTGs  Skilled Therapeutic Interventions/Progress Updates:    Pt seen for OT session focusing on family ed for HEP. Pt in supine upon arrival, decreased alertness, however able to open eyes with verbal and tactile cues. Pt provided with handouts for LE stretches. Reviewed all exercises with pt's family, demonstration and hands on training completed with family member ranging LEs. Pt grimacing throughout. Recommended completing HEP when pt more alert and more able to actively participate in exercises. Also reviewed UE ROM exercises. Educated regarding importance of maintaining ROM for functional tasks.  Family member voiced feeling ready for d/c home tomorrow, all questions answered.   Therapy Documentation Precautions:  Precautions Precautions: Fall Precaution Comments: PEG Restrictions Weight Bearing Restrictions: No ADL: ADL ADL Comments: refer to functional navigator  See Function Navigator for Current Functional Status.   Therapy/Group: Individual Therapy  Lewis, Jaedan Schuman C 06/18/2016, 7:11 AM

## 2016-06-18 NOTE — Progress Notes (Signed)
Speech Language Pathology Discharge Summary  Patient Details  Name: [name] [name] MRN: [id number] Date of Birth: 02/04/1991  Today's Date: 06/18/2016 SLP Individual Time: 1100-1206 SLP Individual Time Calculation (min): 66 min    Skilled Therapeutic Interventions:  Pt O x self, hospital, [facility] and date at mod I. Pt required intermittent cueing to maintain adequate alertness as he did not sleep well last night. Pt independently demonstrating chin tuck behavior with liquids, but requires consistent verbal cueing to reinforce small sip size. Pt's family able to demonstrate ability to feed pt and provide appropriate cues to reinforce aspiration precautions and goals for communication.     Patient has met 8 of 8 long term goals.  Patient to discharge at [facility] level.  Reasons goals not met:     Clinical Impression/Discharge Summary: Pt has made excellent gains during his time at CIR. Pt is now tolerating a Dys 3 diet with thin liquids with swallow strategies to increase safety. Family is able to provide feeding assistance to decrease risk of aspiration due to pt's impulsivity and poor self-monitoring. Pt is able to communicate at the phrase level with 100% intelligibility with min A for occasional clarification with familiar listeners. Pt would benefit from ongoing services to maximize functional independence with cognitive skills.   Care Partner:  Caregiver Able to Provide Assistance: Yes  Type of Caregiver Assistance: Physical;Cognitive  Recommendation:  Home Health SLP;24 hour supervision/assistance  Rationale for SLP Follow Up: Maximize cognitive function and independence;Reduce caregiver burden;Maximize swallowing safety   Equipment:   n/a  Reasons for discharge: Discharged from hospital   Patient/Family Agrees with Progress Made and Goals Achieved: Yes   Function:  Eating Eating   Modified Consistency Diet: Yes Eating Assist Level: Helper scoops food on utensil;Help  managing cup/glass;Supervision or verbal cues     Helper Scoops Food on Utensil: Every scoop     Cognition Comprehension Comprehension assist level: Understands basic 75 - 89% of the time/ requires cueing 10 - 24% of the time  Expression   Expression assist level: Expresses basic 50 - 74% of the time/requires cueing 25 - 49% of the time. Needs to repeat parts of sentences.  Social Interaction Social Interaction assist level: Interacts appropriately 75 - 89% of the time - Needs redirection for appropriate language or to initiate interaction.  Problem Solving Problem solving assist level: Solves basic 25 - 49% of the time - needs direction more than half the time to initiate, plan or complete simple activities  Memory Memory assist level: Recognizes or recalls 25 - 49% of the time/requires cueing 50 - 75% of the time   Vinetta Bergamo MA, CCC-SLP 06/18/2016, 12:29 PM

## 2016-06-18 NOTE — Discharge Instructions (Signed)
Inpatient Rehab Discharge Instructions  [name] [name] Discharge date and time: No discharge date for patient encounter.   Activities/Precautions/ Functional Status: Activity: activity as tolerated Diet: soft Wound Care: none needed Functional status:  ___ No restrictions     ___ Walk up steps independently ___ 24/7 supervision/assistance   ___ Walk up steps with assistance ___ Intermittent supervision/assistance  ___ Bathe/dress independently ___ Walk with walker     _x__ Bathe/dress with assistance ___ Walk Independently    ___ Shower independently ___ Walk with assistance    ___ Shower with assistance ___ No alcohol     ___ Return to work/school ________    COMMUNITY REFERRALS UPON DISCHARGE:    Medical Equipment/Items Ordered: wheelchair, cushion, commode and tub bench                                                     Agency/Supplier:  [facility] @ [phone]   GENERAL COMMUNITY RESOURCES FOR PATIENT/FAMILY:  Support Groups: [place] [facility] (flyer)      Special Instructions:    My questions have been answered and I understand these instructions. I will adhere to these goals and the provided educational materials after my discharge from the hospital.  Patient/Caregiver Signature _______________________________ Date __________  Clinician Signature _______________________________________ Date __________  Please bring this form and your medication list with you to all your follow-up doctor's appointments.

## 2016-06-18 NOTE — Progress Notes (Signed)
Physical Therapy Discharge Summary  Patient Details  Name: [name] [name] MRN: [id number] Date of Birth: [birth year]  Today's Date: 06/18/2016 PT Individual Time: 1300-1400 PT Individual Time Calculation (min): 60 min   Pt treatment focused on continued family education with pt's sister and mother. Pt's sister and mother performed simulated Zenaida Niece transfer with continued cuing from PT for safety, family able to perform safe set up and w/c parts management as well as instruct pt where to place UEs and LEs for safe transfer. Pt's family requires cues for their safety and body mechanics.  Pt's family performed bed mobility and transfer x multiple attempts with min cuing from PT once again for body mechanics to ease their burden.  Pt performed gait with +2 max A due to increased fatigue, pt requires assist for wt shifts and advancing bilat LEs, increasing tone noticed bilat.  Pt's family given handout of exercises to perform for home, pt's sister expresses understanding.    Patient has met 9 of 9 long term goals due to improved activity tolerance, improved balance, improved postural control, increased strength, increased range of motion, ability to compensate for deficits, functional use of  left upper extremity and left lower extremity, improved attention, improved awareness and improved coordination.  Patient to discharge at a wheelchair level Mod Assist +2 for caregivers.   Patient's care partners are independent to provide the necessary physical and cognitive assistance at discharge.  Reasons goals not met: n/a  Recommendation:  Patient will benefit from ongoing skilled PT services in home health setting to continue to advance safe functional mobility, address ongoing impairments in strength, mobility, activity tolerance, balance, gait, and minimize fall risk.  Equipment: w/c, hospital bed  Reasons for discharge: treatment goals met and discharge from hospital  Patient/family agrees with progress  made and goals achieved: Yes  PT Discharge Precautions/RestrictionsPrecautions Precautions: Fall Precaution Comments: PEG Restrictions Weight Bearing Restrictions: No Pain Pain Assessment Pain Assessment: No/denies pain  Cognition Overall Cognitive Status: Impaired/Different from baseline Orientation Level: Oriented to person;Oriented to place;Oriented to situation;Oriented to time Attention: Sustained;Selective Sustained Attention: Appears intact Selective Attention: Impaired Selective Attention Impairment: Functional basic Awareness: Impaired Awareness Impairment: Emergent impairment Behaviors: Poor frustration tolerance Safety/Judgment: Impaired Rancho Mirant Scales of Cognitive Functioning: Programme researcher, broadcasting/film/video Light Touch: Impaired by gross assessment Stereognosis: Impaired by gross assessment Hot/Cold: Appears Intact Proprioception: Impaired by gross assessment Additional Comments: L UE/LE Coordination Gross Motor Movements are Fluid and Coordinated: No Fine Motor Movements are Fluid and Coordinated: No Coordination and Movement Description: Lt hemiplegia Motor  Motor Motor: Hemiplegia;Abnormal tone Motor - Discharge Observations: increased tone in Lt UE, increasing tone in bilat LEs   Trunk/Postural Assessment  Cervical Assessment Cervical Assessment: Within Functional Limits Thoracic Assessment Thoracic Assessment:  (mild kyphosis) Lumbar Assessment Lumbar Assessment:  (posterior pelvic tilt) Postural Control Righting Reactions: delayed Protective Responses: delayed  Balance Static Sitting Balance Static Sitting - Level of Assistance: 5: Stand by assistance Dynamic Sitting Balance Dynamic Sitting - Level of Assistance: 5: Stand by assistance Static Standing Balance Static Standing - Level of Assistance: 3: Mod assist Static Standing - Comment/# of Minutes: able to be mod A when standing at railing Dynamic Standing  Balance Dynamic Standing - Level of Assistance: 1: +1 Total assist Extremity Assessment  RUE Assessment RUE Assessment: Within Functional Limits LUE Assessment LUE Assessment: Exceptions to [facility] (hypertonic, slight active finger flexion and elbow flexion) RLE Assessment RLE Assessment:  (grossly 3/5) LLE Assessment LLE Assessment:  (grossly 3-/5)   See  Function Navigator for Current Functional Status.  Kare Dado 06/18/2016, 2:17 PM
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
## 2016-06-18 NOTE — Discharge Summary (Signed)
Discharge summary job # [id number]
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
## 2016-06-18 NOTE — Progress Notes (Signed)
Occupational Therapy Session Note  Patient Details  Name: [name] [name] MRN: [id number] Date of Birth: 08/15/1991  Today's Date: 06/18/2016 OT Individual Time: 1000-1100 OT Individual Time Calculation (min): 60 min     Short Term Goals:Week 3:  OT Short Term Goal 1 (Week 3): STGs = LTGs  Skilled Therapeutic Interventions/Progress Updates:    Continuation of family education with pt's sister and mother with sister taking active role to demonstrate her proficiency with assisting pt. Pt with poor initiation today and not actively working as much.  Instead of his mother being the 58+2, therapist was +2 for his sister as pt was requiring more A for mobility today.  Pt completed bed to w/c, w/c to toilet and back, w/c to tub bench and back and sit to stands at end of bed using rail. Had long discussion with family, that if there are days like this and he is not actively participating then it will be safer for him to take a bed bath and use a bedpan versus w/c to toilet then to tub bench squat pivot transfers.  Family understood to protect their backs first.   Pt did become more active towards end of session and began participating more.  Family states they are prepared for home.   Therapy Documentation Precautions:  Precautions Precautions: Fall Precaution Comments: PEG Restrictions Weight Bearing Restrictions: No    Vital Signs: Therapy Vitals Temp: 99.4 F (37.4 C) Pulse Rate: 86 Resp: 16 BP: (!) 137/94 Patient Position (if appropriate): Lying Oxygen Therapy SpO2: 100 % O2 Device: Not Delivered Pain: no c/o pain   ADL: ADL ADL Comments: refer to functional navigator  See Function Navigator for Current Functional Status.   Therapy/Group: Individual Therapy  [name] 06/18/2016, 8:25 AM

## 2016-06-18 NOTE — Progress Notes (Signed)
Social Work Patient ID: [name] [name], male   DOB: [birth year], 25 y.o.   MRN: [id number]   Have met with pt, sister and mother to review team conference and the status of arranging f/u [facility] services.  Family feels education is going well and they are ready for tomorrow's d/c.  All DME ordered and delivered.  Have also contacted [employer] to arrange pick up of the trach and oxygen supplies that are at the home.   Have explained to pt/family that I have worked this past week on trying to secure any agency that might be able to provide [facility] follow up services (RN, PT, OT, ST all recommending follow up).  In all, 12 agencies that potentially cover the [facility] area were contacted (a few contacted multiple times to plead for assist!), however, none are willing to take the case unless family can pay privately.  The issue being either the agency cannot staff in their area or they are not willing to take case as a pending Medicaid.  Family understands and report they feel comfortable with education they have been provided by CIR staff on home exercise programs.  Peg is to be removed tomorrow and pt now on D3, thin diet which family can manage without difficulty.    I have enrolled pt in the [facility] medication assist program to get his first month of medications.  Have also enrolled him with the [facility] where he will receive primary medical care and ongoing medication assistance.  These referrals have been reviewed with family.  Pt ready for d/c tomorrow.  Interpreter to be here 9-10 am for d/c instructions and PA aware.  Terryann Verbeek, LCSW

## 2016-06-19 ENCOUNTER — Inpatient Hospital Stay (HOSPITAL_COMMUNITY): Payer: Medicaid Other

## 2016-06-19 DIAGNOSIS — R509 Fever, unspecified: Secondary | ICD-10-CM

## 2016-06-19 LAB — URINALYSIS, ROUTINE W REFLEX MICROSCOPIC
Bilirubin Urine: NEGATIVE
Glucose, UA: NEGATIVE mg/dL
Hgb urine dipstick: NEGATIVE
KETONES UR: NEGATIVE mg/dL
LEUKOCYTES UA: NEGATIVE
NITRITE: NEGATIVE
PH: 5.5 (ref 5.0–8.0)
PROTEIN: 100 mg/dL — AB
Specific Gravity, Urine: 1.016 (ref 1.005–1.030)

## 2016-06-19 LAB — URINE MICROSCOPIC-ADD ON: RBC / HPF: NONE SEEN RBC/hpf (ref 0–5)

## 2016-06-19 MED ORDER — BACLOFEN 10 MG PO TABS: 10.0000 mg | ORAL_TABLET | Freq: Three times a day (TID) | ORAL | 0 refills | Status: DC

## 2016-06-19 MED ORDER — GABAPENTIN 300 MG PO CAPS: 300.0000 mg | ORAL_CAPSULE | Freq: Two times a day (BID) | ORAL | 1 refills | Status: DC

## 2016-06-19 MED ORDER — METOPROLOL TARTRATE 50 MG PO TABS: 50.0000 mg | ORAL_TABLET | Freq: Two times a day (BID) | ORAL | 1 refills | Status: AC

## 2016-06-19 MED ORDER — BACITRACIN ZINC 500 UNIT/GM EX OINT
TOPICAL_OINTMENT | CUTANEOUS | Status: DC | PRN
Start: 2016-06-19 — End: 2016-06-19
  Administered 2016-06-19: 1 via TOPICAL
  Filled 2016-06-19: qty 28.35

## 2016-06-19 MED ORDER — PANTOPRAZOLE SODIUM 40 MG PO TBEC: 40.0000 mg | DELAYED_RELEASE_TABLET | Freq: Every day | ORAL | 0 refills | Status: DC

## 2016-06-19 MED ORDER — LEVOFLOXACIN 500 MG PO TABS: 500.0000 mg | ORAL_TABLET | Freq: Every day | ORAL | 0 refills | Status: AC
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
## 2016-06-19 NOTE — Progress Notes (Signed)
Chest x-ray follow-up showed hypoinflation with unchanged left basilar opacities which may represent atelectasis, although residual recurrent pneumonia not excluded. Also urine with few bacteria and 0-5 WBCs. Will treat empirically with Levaquin 7 days and proceed with discharge home

## 2016-06-19 NOTE — Progress Notes (Signed)
With MD Allena KatzPatel this morning. Peg tube removed at bed site. Area clean and dry. Dry gauze and tape applied. [name] [name] with family and patient with interpreter present. Family denied any questions. Family educated on peg tube site care and instructed patient able to take medications and start eating at 1200. Family verbalized understanding through interpreter. Bacitracin to be applied and given to family prior to discharge.

## 2016-06-19 NOTE — Progress Notes (Signed)
Patient discharged home with family with all discharge instructions and prescriptions about 1210.
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
## 2016-06-20 LAB — URINE CULTURE

## 2016-06-22 ENCOUNTER — Telehealth: Payer: Self-pay | Admitting: *Deleted

## 2016-06-22 NOTE — Telephone Encounter (Signed)
1st attempt,Transitional care call, Left voice message

## 2016-06-22 NOTE — Telephone Encounter (Signed)
2nd attempt, transitional care call, left message on voicemail to call back

## 2016-06-24 ENCOUNTER — Encounter: Payer: Self-pay | Admitting: Physical Medicine & Rehabilitation

## 2016-06-24 ENCOUNTER — Encounter: Payer: Medicaid Other | Attending: Physical Medicine & Rehabilitation | Admitting: Physical Medicine & Rehabilitation

## 2016-06-24 VITALS — BP 131/90 | HR 85 | Resp 14

## 2016-06-24 DIAGNOSIS — G8194 Hemiplegia, unspecified affecting left nondominant side: Secondary | ICD-10-CM | POA: Insufficient documentation

## 2016-06-24 DIAGNOSIS — R202 Paresthesia of skin: Secondary | ICD-10-CM | POA: Insufficient documentation

## 2016-06-24 DIAGNOSIS — Z87820 Personal history of traumatic brain injury: Secondary | ICD-10-CM | POA: Diagnosis not present

## 2016-06-24 DIAGNOSIS — N179 Acute kidney failure, unspecified: Secondary | ICD-10-CM | POA: Diagnosis not present

## 2016-06-24 DIAGNOSIS — R531 Weakness: Secondary | ICD-10-CM | POA: Insufficient documentation

## 2016-06-24 DIAGNOSIS — K592 Neurogenic bowel, not elsewhere classified: Secondary | ICD-10-CM | POA: Insufficient documentation

## 2016-06-24 DIAGNOSIS — M109 Gout, unspecified: Secondary | ICD-10-CM | POA: Insufficient documentation

## 2016-06-24 DIAGNOSIS — G8114 Spastic hemiplegia affecting left nondominant side: Secondary | ICD-10-CM

## 2016-06-24 DIAGNOSIS — M25462 Effusion, left knee: Secondary | ICD-10-CM

## 2016-06-24 DIAGNOSIS — R1310 Dysphagia, unspecified: Secondary | ICD-10-CM | POA: Diagnosis not present

## 2016-06-24 DIAGNOSIS — S069X4S Unspecified intracranial injury with loss of consciousness of 6 hours to 24 hours, sequela: Secondary | ICD-10-CM | POA: Diagnosis not present

## 2016-06-24 DIAGNOSIS — N319 Neuromuscular dysfunction of bladder, unspecified: Secondary | ICD-10-CM | POA: Diagnosis not present

## 2016-06-24 MED ORDER — IBUPROFEN 200 MG PO TABS: 400.0000 mg | ORAL_TABLET | Freq: Three times a day (TID) | ORAL | 0 refills | Status: AC | PRN

## 2016-06-24 NOTE — Telephone Encounter (Signed)
Patient arrived for clinic visit, however, transitional care call was never completed

## 2016-06-24 NOTE — Patient Instructions (Signed)
Gota   (Gout)   La gota es una artritis inflamatoria originada por la acumulación de cristales de ácido úrico en las articulaciones. El ácido úrico es una sustancia química que normalmente se encuentra en la sangre. Cuando los niveles de ácido úrico en la sangre son muy elevados, pueden formarse cristales que se depositan en las articulaciones y los tejidos. Esto causa irritación, dolor e hinchazón (inflamación). La repetición de los ataques es frecuente. Con el tiempo, los cristales de ácido úrico pueden formar masas (tofos) cerca de una articulación, destruyen el hueso y provocan una deformidad La gota es una enfermedad que puede tratarse y con frecuencia puede prevenirse.  CAUSAS   La enfermedad comienza con niveles altos de ácido úrico en la sangre. El organismo produce ácido úrico cuando metaboliza una sustancia que se encuentra en estado natural, denominada purina. Ciertos alimentos, como carnes y pescado, contienen grandes cantidades de purinas. Las causas de ácido úrico elevado son:   · Ser transmitida de padres a hijos (hereditaria).  · Enfermedades que causan un aumento de la producción de ácido úrico (como la obesidad, la psoriasis y ciertos tipos de cáncer).  · Abuso en el consumo de bebidas alcohólicas.  · La dieta, especialmente las dietas en las que se consume mucha carne y frutos de mar.  · Ciertos medicamentos, como los que combaten el cáncer (quimioterapia), los diuréticos y la aspirina.  · Enfermedades renales crónicas. Los riñones no pueden eliminar bien el ácido úrico.  · Problemas con el metabolismo.  Las enfermedades fuertemente asociadas a la gota son:   · Obesidad.  · Hipertensión arterial.  · Colesterol elevado.  · Diabetes.  No todas las personas con niveles elevados de ácido úrico sufren gota. No se comprende aún por qué algunas personas padecen gota y otras no. Las cirugías, lesiones en una articulación y el consumo excesivo de ciertos alimentos son algunos de los factores que pueden  provocar ataques de gota.   SÍNTOMAS   · Un ataque de gota puede comenzar rápidamente. Causa un dolor intenso, con irritación, hinchazón y calor en una articulación.  · Puede haber fiebre.  · Generalmente sólo una articulación es afectada. Ciertas articulaciones se ven implicadas con más frecuencia.  ¨ La base del dedo gordo del pie.  ¨ La rodilla.  ¨ El tobillo.  ¨ La muñeca.  ¨ Un dedo.  Sin tratamiento, un ataque por lo general desaparece en unos pocos días o semanas. Entre un ataque y otro, por lo general no hay síntomas, lo cual es diferente de muchas otras formas de artritis.   DIAGNÓSTICO   El profesional reunirá la información basándose en los síntomas y el examen físico. En algunos casos indicará estudios. Estos estudios pueden ser:   · Análisis de sangre.  · Análisis de orina.  · Radiografías.  · Análisis de los fluidos de la articulación. En este estudio se necesita una aguja para extraer líquido de la articulación (artrocentesis). Con el uso del microscopio, se confirma la gota cuando se observan los cristales de ácido úrico en el líquido de la articulación.  TRATAMIENTO   Hay dos fases en el tratamiento de la gota: el tratamiento del ataque de aparición repentina (agudo) y la prevención de los ataques (profilaxis).   · Tratamiento de un ataque agudo.  ¨ Se utilizan medicamentos. Se indican antiinflamatorios o corticoides.  ¨ En algunos casos es necesario inyectar un corticoide en la articulación afectada.  ¨ La articulación dolorosa se pone en reposo. El movimiento puede   empeorar la artritis.  ¨ Puede utilizar tanto tratamientos con calor o con frío para aliviar el dolor en las articulaciones, según lo que le haga mejor.  · Tratamiento para prevenir ataques.  ¨ Si sufre de ataques de gota frecuentes, el médico puede recomendarle medicamentos preventivos. Estos medicamentos se administran después de que el ataque agudo mejora. Estos medicamentos ayudan a los riñones a eliminar el ácido úrico del  organismo o a disminuir la producción de ácido úrico. Es posible que deba utilizar estos medicamentos durante un largo tiempo.  ¨ La primera fase del tratamiento preventiva puede asociarse con un aumento de los ataques agudos de gota. Por esta razón, durante los primeros meses de tratamiento, su médico puede también aconsejarle que tome medicamentos que habitualmente se utilizan para el tratamiento de la gota aguda. Asegúrese de comprender todas las indicaciones del médico. El médico podrá hacer varios ajustes a la dosis del medicamento antes de que comiencen a ser efectivos.  ¨ Comente el tratamiento dietético con su médico o nutricionista. El alcohol y las bebidas que contienen gran cantidad de azúcar y fructosa y los alimentos como la carne, el pollo y los frutos de mar pueden aumentar los niveles de ácido úrico. El médico o el nutricionista podrá aconsejarlo sobre las bebidas y los alimentos que debe limitar.  INSTRUCCIONES PARA EL CUIDADO EN EL HOGAR   · No tome aspirina para el dolor. Esto eleva los niveles de ácido úrico.  · Sólo tome medicamentos de venta libre o prescriptos para calmar el dolor, las molestias o bajar la fiebre según las indicaciones de su médico.  · Haga reposo todo el tiempo que pueda. Cuando se encuentre en la cama, mantenga las sábanas y mantas alejadas de las articulaciones doloridas.  · Mantenga la articulación afectada levantada (elevada).  · Aplique compresas frías o calientes sobre las articulaciones doloridas. el uso de compresas calientes o frías depende de lo que mejor le resulte a usted.  · Utilice muletas si la articulación que le duele es de la pierna.  · Debe ingerir gran cantidad de líquido para mantener la orina de tono claro o color amarillo pálido. Esto ayudará a que el organismo elimine el ácido úrico. Limite el consumo de alcohol, bebidas azucaradas y que contengan fructosa.  · Siga las indicaciones con respecto a la dieta. Preste especial atención a la cantidad de  proteínas que consume. En la dieta diaria debe enfatizar el consumo de frutas, vegetales, granos enteros y productos lácteos descremados. Comente con su médico o nutricionista acerca del consumo de café, vitamina C o cerezas. Estos pueden ayudar a disminuir los niveles de ácido úrico.  · Mantenga un peso corporal adecuado.  SOLICITE ATENCIÓN MÉDICA SI:   · Tiene diarrea, vómitos o algún efecto secundario provocado por los medicamentos.  · No se siente mejor en 24 horas, o empeora.  SOLICITE ATENCIÓN MÉDICA DE INMEDIATO SI:   · Las articulaciones le duelen más de manera repentina, tiene escalofríos o fiebre.  ASEGÚRESE DE QUE:   · Comprende estas instrucciones.  · Controlará su enfermedad.  · Solicitará ayuda de inmediato si no mejora o si empeora.     Esta información no tiene como fin reemplazar el consejo del médico. Asegúrese de hacerle al médico cualquier pregunta que tenga.     Document Released: 05/13/2005 Document Revised: 11/28/2012  Elsevier Interactive Patient Education ©2016 [employer] Inc[employer]

## 2016-06-24 NOTE — Progress Notes (Signed)
Subjective:    Patient ID: [name] [name], male    DOB: 05/11/1991, 25 y.o.   MRN: [id number]   Transitional Care Clinic Visits HPI   [name] [name] is here in follow up of his TBI and associated deficits. He has had some problems with swelling in his knees since he's been home. Mother notes he had problems with gout before the injury. Red meat makes the symptoms worse. I had taken him off the colchicine due to renal insufficiency. They used some ibuprofen which helped.   Family is at home providing full care. Eating well. Drinking well. Bowels and bladder are functioning. Family is exercising him and stretching him at home. He sill has some tightness in the left leg. Left arm and hand still tingle. He is generally continent now but still can be incontinent at night.   He denies double vision or blurred vision or eye pain.    Pain Inventory Average Pain 0 Pain Right Now 0 My pain is no pain  In the last 24 hours, has pain interfered with the following? General activity 4 Relation with others 4 Enjoyment of life 7 What TIME of day is your pain at its worst? all Sleep (in general) Good  Pain is worse with: bending, sitting and standing Pain improves with: no pain Relief from Meds: no pain  Mobility walk with assistance ability to climb steps?  no do you drive?  no use a wheelchair Do you have any goals in this area?  yes  Function disabled: date disabled . I need assistance with the following:  feeding, dressing, bathing, toileting, meal prep and household duties Do you have any goals in this area?  yes  Neuro/Psych weakness tremor trouble walking  Prior Studies hospital f/u  Physicians involved in your care hospital f/u   History reviewed. No pertinent family history. Social History   Social History  . Marital status: Married    Spouse name: N/A  . Number of children: N/A  . Years of education: N/A   Social History Main Topics  . Smoking status: Never Smoker  .  Smokeless tobacco: Never Used  . Alcohol use No     Comment: once a year  . Drug use: No  . Sexual activity: Yes   Other Topics Concern  . None   Social History Narrative  . None   Past Surgical History:  Procedure Laterality Date  . PEG TUBE PLACEMENT    . TRACHEOSTOMY     Past Medical History:  Diagnosis Date  . Acute renal failure (ARF) (HCC)   . Bacteremia   . Closed fracture of styloid process of right ulna with delayed healing   . Hemiplegia affecting left nondominant side (HCC)   . Pneumonia   . SIRS (systemic inflammatory response syndrome) (HCC)   . Traumatic brain injury (HCC)    BP 131/90 (BP Location: Right Arm, Patient Position: Sitting, Cuff Size: Large)   Pulse 85   Resp 14   SpO2 99%   Opioid Risk Score:   Fall Risk Score:  `1  Depression screen PHQ 2/9  No flowsheet data found.  Review of Systems  HENT: Negative.   Eyes: Negative.   Respiratory: Negative.   Cardiovascular: Negative.   Gastrointestinal: Negative.   Endocrine: Negative.   Genitourinary: Negative.   Musculoskeletal: Positive for gait problem.  Skin: Negative.   Allergic/Immunologic: Negative.   Neurological: Positive for weakness and numbness.  Psychiatric/Behavioral: Negative.   All other systems reviewed and are  negative.      Objective:   Physical Exam Gen: NAD.  HENT: Normocephalic Eyes: EOMI in right eye. Left eye limited in vertical planes.  Neck: supple  Cardiovascular: Normal rateand regular rhythm.  Respiratory: Effort normaland breath sounds normal..  GI: Soft. Bowel sounds are normal. PEG out.  Neurological: Dysconjugate gaze  Tracks with right eye. Left eye limited vertically Follows simple one step commands.    mAS tr/4 wrist flexors LUE, Left hamstrings/gastrocs   tr-1/4  Musculoskeletal: bilateral knees without swelling, pain or erthyema, normal PROM.  Psych: flat, cooperative.  Skin: intact        Assessment & Plan:  1. Weakness, left  hemiparesthesiasecondary to TBI 03/02/2016 complicated by sepsis and respiratory failure -continue HEP, family ed    -reviwed exercises for his left eye            2.  Pain Management: Tylenol as needed             -gabapentin maitained at 300mg  for neuropathic left-sided pain, improving          4. Tracheostomy: trach site healed  8. Dysphagia--D3 diet. 9.ESBL Klebsiella in sputum culture. Continue contact precautions 11.Gout. 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 Neurogenic Bladder/bowel:               -timed voiding at home to continue 14. AKI             Cr essentially stable at 1.39 right before dc             Colchicine held             -asked his primary to check when he sees him next week.   -encourage fluids  Return in about 2 months (around 08/24/2016). Thirty minutes of face to face patient care time were spent during this visit. All questions were encouraged and answered.-

## 2016-08-24 ENCOUNTER — Encounter: Payer: Self-pay | Admitting: Physical Medicine & Rehabilitation

## 2016-08-24 ENCOUNTER — Encounter: Payer: Medicaid Other | Attending: Physical Medicine & Rehabilitation | Admitting: Physical Medicine & Rehabilitation

## 2016-08-24 VITALS — BP 126/85 | HR 77 | Resp 14

## 2016-08-24 DIAGNOSIS — R202 Paresthesia of skin: Secondary | ICD-10-CM | POA: Diagnosis not present

## 2016-08-24 DIAGNOSIS — K592 Neurogenic bowel, not elsewhere classified: Secondary | ICD-10-CM | POA: Insufficient documentation

## 2016-08-24 DIAGNOSIS — N179 Acute kidney failure, unspecified: Secondary | ICD-10-CM | POA: Diagnosis not present

## 2016-08-24 DIAGNOSIS — M25562 Pain in left knee: Secondary | ICD-10-CM | POA: Diagnosis not present

## 2016-08-24 DIAGNOSIS — Z87820 Personal history of traumatic brain injury: Secondary | ICD-10-CM | POA: Diagnosis not present

## 2016-08-24 DIAGNOSIS — N319 Neuromuscular dysfunction of bladder, unspecified: Secondary | ICD-10-CM | POA: Insufficient documentation

## 2016-08-24 DIAGNOSIS — R531 Weakness: Secondary | ICD-10-CM | POA: Diagnosis not present

## 2016-08-24 DIAGNOSIS — G8114 Spastic hemiplegia affecting left nondominant side: Secondary | ICD-10-CM | POA: Diagnosis not present

## 2016-08-24 DIAGNOSIS — M109 Gout, unspecified: Secondary | ICD-10-CM | POA: Insufficient documentation

## 2016-08-24 DIAGNOSIS — R1310 Dysphagia, unspecified: Secondary | ICD-10-CM | POA: Diagnosis not present

## 2016-08-24 DIAGNOSIS — G8194 Hemiplegia, unspecified affecting left nondominant side: Secondary | ICD-10-CM | POA: Insufficient documentation

## 2016-08-24 DIAGNOSIS — S069X4S Unspecified intracranial injury with loss of consciousness of 6 hours to 24 hours, sequela: Secondary | ICD-10-CM | POA: Diagnosis not present

## 2016-08-24 MED ORDER — METHOCARBAMOL 500 MG PO TABS: 500.0000 mg | ORAL_TABLET | Freq: Four times a day (QID) | ORAL | 2 refills | Status: DC | PRN

## 2016-08-24 NOTE — Patient Instructions (Signed)
Apply heat to left knee and ankle to help stretch.   Knee sleeve.

## 2016-08-24 NOTE — Progress Notes (Signed)
Subjective:    Patient ID: [name] [name], male    DOB: 1991/05/04, 26 y.o.   MRN: [id number]  HPI   Amrit is here in follow up of his TBI. He is still complaining of left knee pain which comes and goes. His left ankle is similar in presentation. It tends to be worse when he is exercising or walking on the leg. He is no longer taking ibuprofen due to direction of primary although the little amount he was taking didn't really help him any more. He remains on gabapentin for neuropathic pain and is taking baclofen for muscle spasms. When I queried him more about the knee and ankle pain, both were posterior.   He Is walking with a walker daily with family. He just received his MCD. Apparently his primary set him up with therapy at [facility].     Pain Inventory Average Pain 5 Pain Right Now n/a My pain is tingling  In the last 24 hours, has pain interfered with the following? General activity 0 Relation with others 0 Enjoyment of life 5 What TIME of day is your pain at its worst? evening Sleep (in general) Good  Pain is worse with: walking, bending, standing and some activites Pain improves with: n/a Relief from Meds: n/a  Mobility use a walker how many minutes can you walk? 30 ability to climb steps?  no do you drive?  no use a wheelchair needs help with transfers Do you have any goals in this area?  yes  Function I need assistance with the following:  feeding, dressing, bathing, toileting, meal prep, household duties and shopping  Neuro/Psych bladder control problems bowel control problems  Prior Studies Any changes since last visit?  no  Physicians involved in your care Any changes since last visit?  no   No family history on file. Social History   Social History  . Marital status: Married    Spouse name: N/A  . Number of children: N/A  . Years of education: N/A   Social History Main Topics  . Smoking status: Never Smoker  . Smokeless tobacco: Never Used  .  Alcohol use No     Comment: once a year  . Drug use: No  . Sexual activity: Yes   Other Topics Concern  . None   Social History Narrative  . None   Past Surgical History:  Procedure Laterality Date  . PEG TUBE PLACEMENT    . TRACHEOSTOMY     Past Medical History:  Diagnosis Date  . Acute renal failure (ARF) (HCC)   . Bacteremia   . Closed fracture of styloid process of right ulna with delayed healing   . Hemiplegia affecting left nondominant side (HCC)   . Pneumonia   . SIRS (systemic inflammatory response syndrome) (HCC)   . Traumatic brain injury (HCC)    BP 126/85   Pulse 77   Resp 14   SpO2 97%   Opioid Risk Score:   Fall Risk Score:  `1  Depression screen PHQ 2/9  Depression screen PHQ 2/9 06/24/2016  Decreased Interest 1  Down, Depressed, Hopeless 0  PHQ - 2 Score 1  Altered sleeping 1  Tired, decreased energy 0  Change in appetite 0  Feeling bad or failure about yourself  0  Trouble concentrating 0  Moving slowly or fidgety/restless 2  Suicidal thoughts 0  PHQ-9 Score 4     Review of Systems  Constitutional: Negative.   HENT: Negative.   Eyes:  Negative.   Respiratory: Negative.   Cardiovascular: Negative.   Gastrointestinal: Negative.   Endocrine: Negative.   Genitourinary: Negative.   Musculoskeletal: Negative.   Skin: Negative.   Allergic/Immunologic: Negative.   Neurological: Negative.   Hematological: Negative.   Psychiatric/Behavioral: Negative.   All other systems reviewed and are negative.      Objective:   Physical Exam  Gen: NAD.  HENT: Normocephalic Eyes: EOMI in right eye. Left eye limited in vertical planes.  Neck: supple  Cardiovascular: RRR.  Respiratory: Effort normaland breath sounds normal..  GI: BS+.  Neurological: Dysconjugate gaze  Tracks with right eye. Left eye limited vertically Follows simple one step commands.  Motor nearly 4-5/5 in all 4. May be more limited in abduction on left ?4- but inconsistent.    mAS tr to absent/4 wrist flexors LUE, Left hamstrings/gastrocs tr/4  Musculoskeletal: bilateral knees without swelling, pain or erthyema upon med/ant/lateral palp. He does have some tenderness along semimembranosus/tendinosis muscle as well as biceps femoris---more notable with knee extension. Stood without antalgia. Knee hyperextends slightly.  Psych: flat, cooperative.  Skin: intact        Assessment & Plan:  1. Weakness, left hemiparesthesiasecondary to TBI 03/02/2016 complicated by sepsis and respiratory failure -sister will call if outpt therapies aren't arranged ([facility]) 2.  Pain Management: Tylenol as needed -gabapentin continue at 300mg  for neuropathic left-sided pain   -add robaxin prn for spasms  -reivewed stretching exercises for left knee/hamstrings  11.Gout. Colchicine stopped due to AKI             -tylenol prn---less likely gout on exam today.              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 Neurogenic Bladder/bowel:  -timed voiding at home to continue 14. AKI/CKI   Return in about 3 months. Over 30 minutes spent counseling pt/family regarding pain, neurological issues, follow up needs etc

## 2016-11-23 ENCOUNTER — Ambulatory Visit: Payer: Medicaid Other | Admitting: Physical Medicine & Rehabilitation

## 2016-11-25 ENCOUNTER — Encounter: Payer: Medicaid Other | Attending: Physical Medicine & Rehabilitation | Admitting: Physical Medicine & Rehabilitation

## 2016-11-25 ENCOUNTER — Encounter: Payer: Self-pay | Admitting: Physical Medicine & Rehabilitation

## 2016-11-25 VITALS — BP 171/106 | HR 58 | Resp 14

## 2016-11-25 DIAGNOSIS — R202 Paresthesia of skin: Secondary | ICD-10-CM | POA: Diagnosis not present

## 2016-11-25 DIAGNOSIS — G8194 Hemiplegia, unspecified affecting left nondominant side: Secondary | ICD-10-CM | POA: Diagnosis not present

## 2016-11-25 DIAGNOSIS — Z87820 Personal history of traumatic brain injury: Secondary | ICD-10-CM | POA: Insufficient documentation

## 2016-11-25 DIAGNOSIS — R1310 Dysphagia, unspecified: Secondary | ICD-10-CM | POA: Insufficient documentation

## 2016-11-25 DIAGNOSIS — S069X4S Unspecified intracranial injury with loss of consciousness of 6 hours to 24 hours, sequela: Secondary | ICD-10-CM | POA: Diagnosis not present

## 2016-11-25 DIAGNOSIS — K592 Neurogenic bowel, not elsewhere classified: Secondary | ICD-10-CM | POA: Diagnosis not present

## 2016-11-25 DIAGNOSIS — R531 Weakness: Secondary | ICD-10-CM | POA: Diagnosis not present

## 2016-11-25 DIAGNOSIS — G8114 Spastic hemiplegia affecting left nondominant side: Secondary | ICD-10-CM

## 2016-11-25 DIAGNOSIS — M792 Neuralgia and neuritis, unspecified: Secondary | ICD-10-CM | POA: Diagnosis not present

## 2016-11-25 DIAGNOSIS — N319 Neuromuscular dysfunction of bladder, unspecified: Secondary | ICD-10-CM | POA: Insufficient documentation

## 2016-11-25 DIAGNOSIS — M109 Gout, unspecified: Secondary | ICD-10-CM | POA: Diagnosis not present

## 2016-11-25 DIAGNOSIS — N179 Acute kidney failure, unspecified: Secondary | ICD-10-CM | POA: Diagnosis not present

## 2016-11-25 NOTE — Progress Notes (Signed)
Subjective:    Patient ID: [name] [name], male    DOB: 1990-12-27, 26 y.o.   MRN: [id number]  HPI  Mr. Magid is here in follow up of his TBI. He has been in therapy at [facility] receiving PT and OT. They have been addressing gait as well as upper extremity rom. His pain has been much better controlled. He is independent at home with basic activities. He isn't doing a lot other than HEP. Pt denies headaches and states that his vision is improved.   He is sleeping well and his bowel and bladder function are improved.   Pain Inventory Average Pain 0 Pain Right Now 0 My pain is no pain  In the last 24 hours, has pain interfered with the following? General activity 0 Relation with others 0 Enjoyment of life 0 What TIME of day is your pain at its worst? no pain Sleep (in general) Good  Pain is worse with: no pain Pain improves with: no pain Relief from Meds: no pain  Mobility walk with assistance  Function disabled: date disabled .  Neuro/Psych trouble walking  Prior Studies Any changes since last visit?  no  Physicians involved in your care Any changes since last visit?  no   No family history on file. Social History   Social History  . Marital status: Married    Spouse name: N/A  . Number of children: N/A  . Years of education: N/A   Social History Main Topics  . Smoking status: Never Smoker  . Smokeless tobacco: Never Used  . Alcohol use No     Comment: once a year  . Drug use: No  . Sexual activity: Yes   Other Topics Concern  . None   Social History Narrative  . None   Past Surgical History:  Procedure Laterality Date  . PEG TUBE PLACEMENT    . TRACHEOSTOMY     Past Medical History:  Diagnosis Date  . Acute renal failure (ARF) (HCC)   . Bacteremia   . Closed fracture of styloid process of right ulna with delayed healing   . Hemiplegia affecting left nondominant side (HCC)   . Pneumonia   . SIRS (systemic inflammatory response syndrome)  (HCC)   . Traumatic brain injury (HCC)    BP (!) 171/106 (BP Location: Right Arm, Patient Position: Sitting, Cuff Size: Normal)   Pulse (!) 58   Resp 14   SpO2 98%   Opioid Risk Score:   Fall Risk Score:  `1  Depression screen PHQ 2/9  Depression screen PHQ 2/9 06/24/2016  Decreased Interest 1  Down, Depressed, Hopeless 0  PHQ - 2 Score 1  Altered sleeping 1  Tired, decreased energy 0  Change in appetite 0  Feeling bad or failure about yourself  0  Trouble concentrating 0  Moving slowly or fidgety/restless 2  Suicidal thoughts 0  PHQ-9 Score 4    Review of Systems  Constitutional: Negative.   HENT: Negative.   Eyes: Negative.   Respiratory: Negative.   Cardiovascular: Negative.   Gastrointestinal: Negative.   Endocrine: Negative.   Genitourinary: Negative.   Musculoskeletal: Positive for gait problem.  Skin: Negative.   Hematological: Negative.   Psychiatric/Behavioral: The patient is nervous/anxious.   All other systems reviewed and are negative.      Objective:   Physical Exam Gen: NAD.  HENT: Normocephalic Eyes: EOMI in right eye. Left eye limited in vertical planes.  Neck: supple Cardiovascular: RRR Respiratory: normal effort.Marland Kitchen  GI: BS+.  Neurological: a and oriented x 3 Tracks with right eye. Left eye limited vertically but he denies diplopia Follows simple one step commands.  Motor nearly 5/5 in all 4. Occasional catch in left shoulder and left leg. More apraxia than tone.  Musculoskeletal: LEFT shoulder a little tight but no pain. ful ROM LLE Psych: flat, cooperative.  Skin: intact      Assessment & Plan:  1. Weakness, left hemiparesthesiasecondary to TBI 03/02/2016 complicated by sepsis and respiratory failure -he continues with outpt therapies at [facility]  -reviewed gait techniques/mechanics in detail 2.  Pain Management: Tylenol as needed -gabapentin wean to off. Instructions given              -continue baclofen           11.Gout. 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 Neurogenic Bladder/bowel:  -timed voiding at home to continue 14. HTN: family states bp has been controlled at home. I advised that if elevation continues that they should call me. Family understood   Return in about 3 months. Over 15 minutes spent counseling pt/family regarding pain, neurological issues, follow up needs etc

## 2016-11-25 NOTE — Patient Instructions (Addendum)
DURING WALKING:  CONTINUE WORKING ON BENDING YOUR LEFT KNEE WHEN YOU SWING THE LEG AND LANDING ON YOUR LEFT HEEL WHEN YOUR FOOT HITS THE GROUND. ALSO TRY TO KEEP TOES POINTED MORE STRAIGHT (AS OPPOSED TO ROTATED OUTWARD).   GABAPENTIN (DECREASE TO OFF):  TAKE ONE PILL AT BEDTIME FOR TWO WEEKS THEN STOP.    PLEASE FEEL FREE TO CALL OUR OFFICE WITH ANY PROBLEMS OR QUESTIONS [phone])

## 2017-02-23 ENCOUNTER — Encounter: Payer: Medicaid Other | Attending: Physical Medicine & Rehabilitation | Admitting: Physical Medicine & Rehabilitation

## 2017-02-23 DIAGNOSIS — K592 Neurogenic bowel, not elsewhere classified: Secondary | ICD-10-CM | POA: Insufficient documentation

## 2017-02-23 DIAGNOSIS — G8194 Hemiplegia, unspecified affecting left nondominant side: Secondary | ICD-10-CM | POA: Insufficient documentation

## 2017-02-23 DIAGNOSIS — R202 Paresthesia of skin: Secondary | ICD-10-CM | POA: Insufficient documentation

## 2017-02-23 DIAGNOSIS — Z87820 Personal history of traumatic brain injury: Secondary | ICD-10-CM | POA: Insufficient documentation

## 2017-02-23 DIAGNOSIS — M109 Gout, unspecified: Secondary | ICD-10-CM | POA: Insufficient documentation

## 2017-02-23 DIAGNOSIS — N179 Acute kidney failure, unspecified: Secondary | ICD-10-CM | POA: Insufficient documentation

## 2017-02-23 DIAGNOSIS — R531 Weakness: Secondary | ICD-10-CM | POA: Insufficient documentation

## 2017-02-23 DIAGNOSIS — R1310 Dysphagia, unspecified: Secondary | ICD-10-CM | POA: Insufficient documentation

## 2017-02-23 DIAGNOSIS — N319 Neuromuscular dysfunction of bladder, unspecified: Secondary | ICD-10-CM | POA: Insufficient documentation

## 2017-03-25 IMAGING — DX DG CHEST 1V PORT SAME DAY
1 series · 1 of 1 positions shown · non-contrast
Comparison: Prior chest x-ray 05/16/2016; prior CT scan of the
chest 05/17/2016

CLINICAL DATA: 25-year-old male with a history of traumatic brain
injury and now fever

EXAM:
PORTABLE CHEST 1 VIEW

[chest ap]
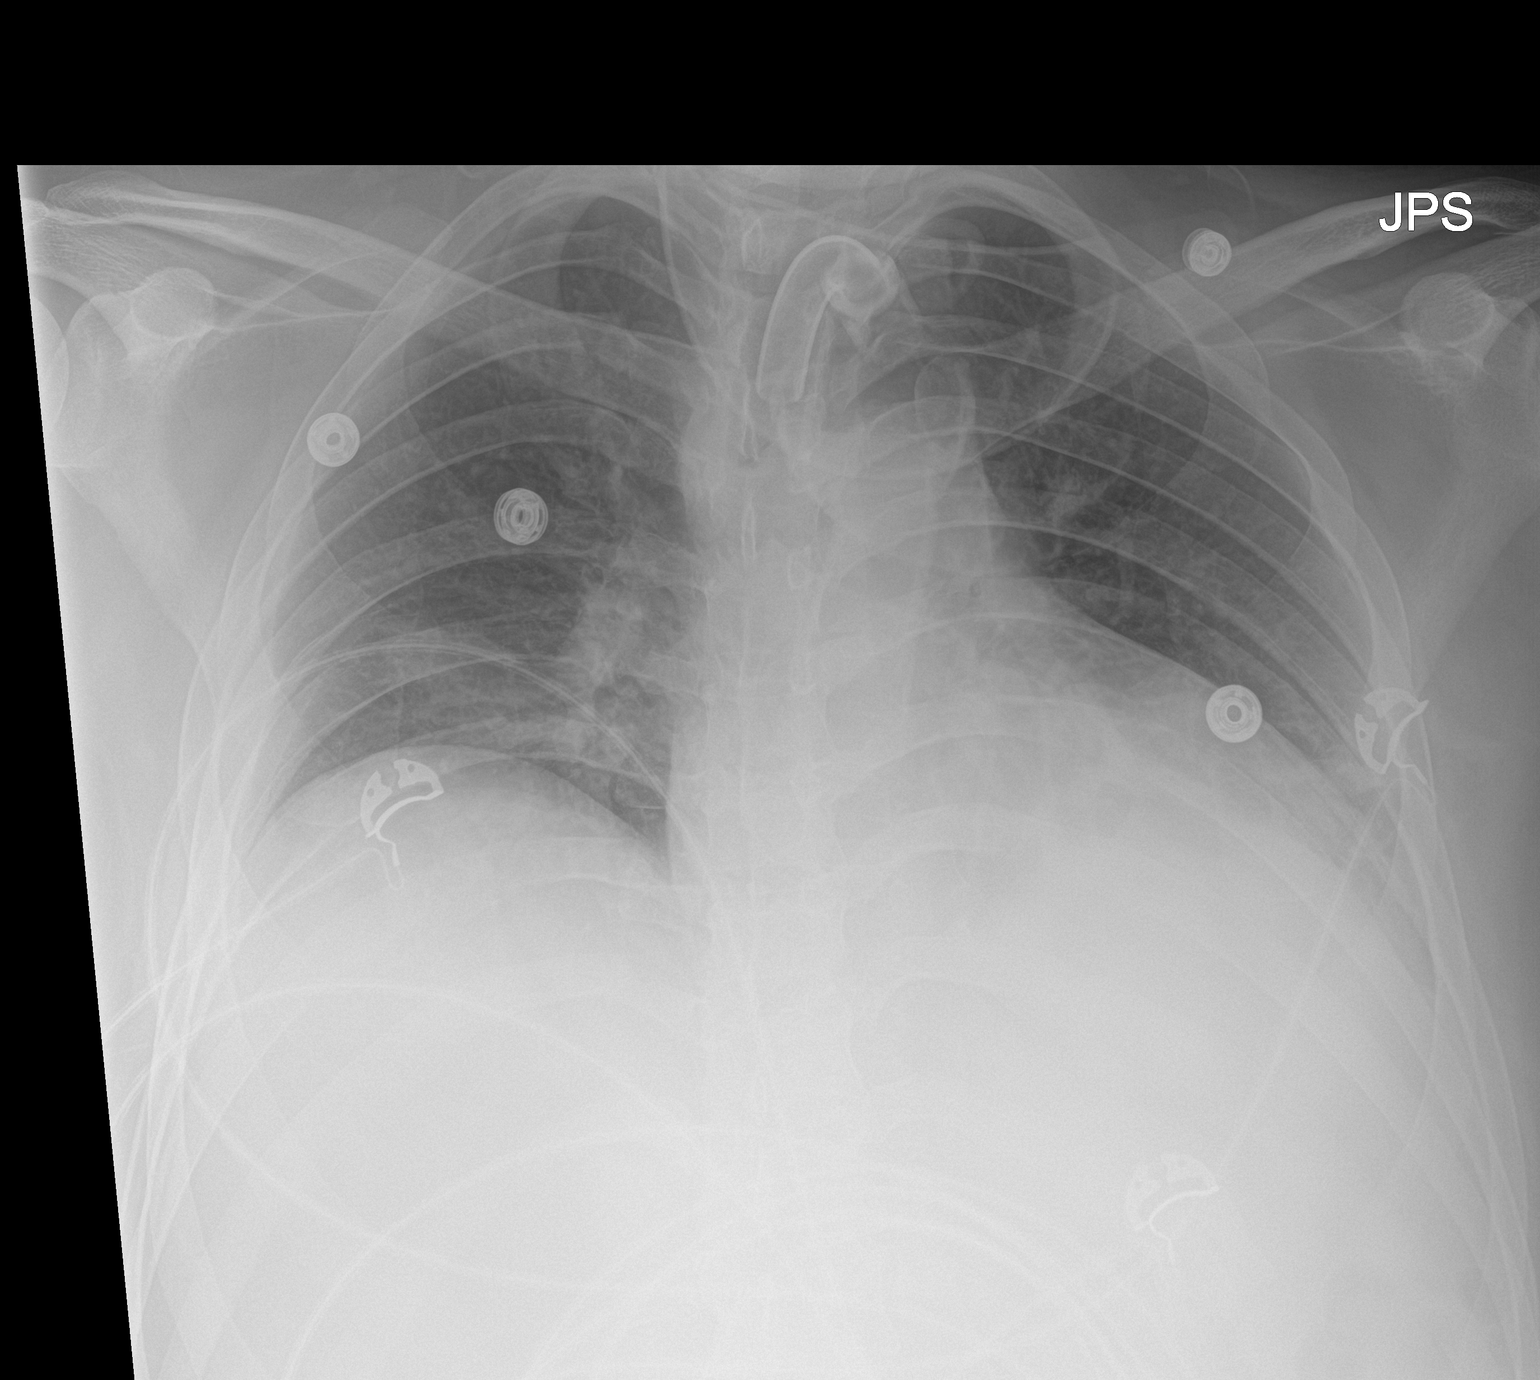

[1 of 1 positions shown; findings below may reference images not displayed]

FINDINGS: The tracheostomy tube is midline and at the level of the clavicles
in good position. Cardiac and mediastinal contours remain unchanged.
Slight interval increase in bibasilar patchy airspace opacities
particularly on the left with partial obscure a shin of the left
hemidiaphragm. Inspiratory volumes remain very low. The osseous
structures are intact and unremarkable for age.
IMPRESSION: 1. Compared to 05/16/2016 slightly increased bilateral left greater
than right patchy lower lobe airspace opacities. Differential
considerations include atelectasis and/or infiltrate, particularly
on the left.
2. Inspiratory volumes remain very low.
3. Well-positioned tracheostomy tube.

## 2017-04-13 IMAGING — DX DG CHEST 2V
2 series · 2 of 2 positions shown · non-contrast
Comparison: One-view chest x-ray 05/21/2016

CLINICAL DATA: Fever and lethargy.  Left-sided paralysis.

EXAM:
CHEST  2 VIEW

[x chest ap]
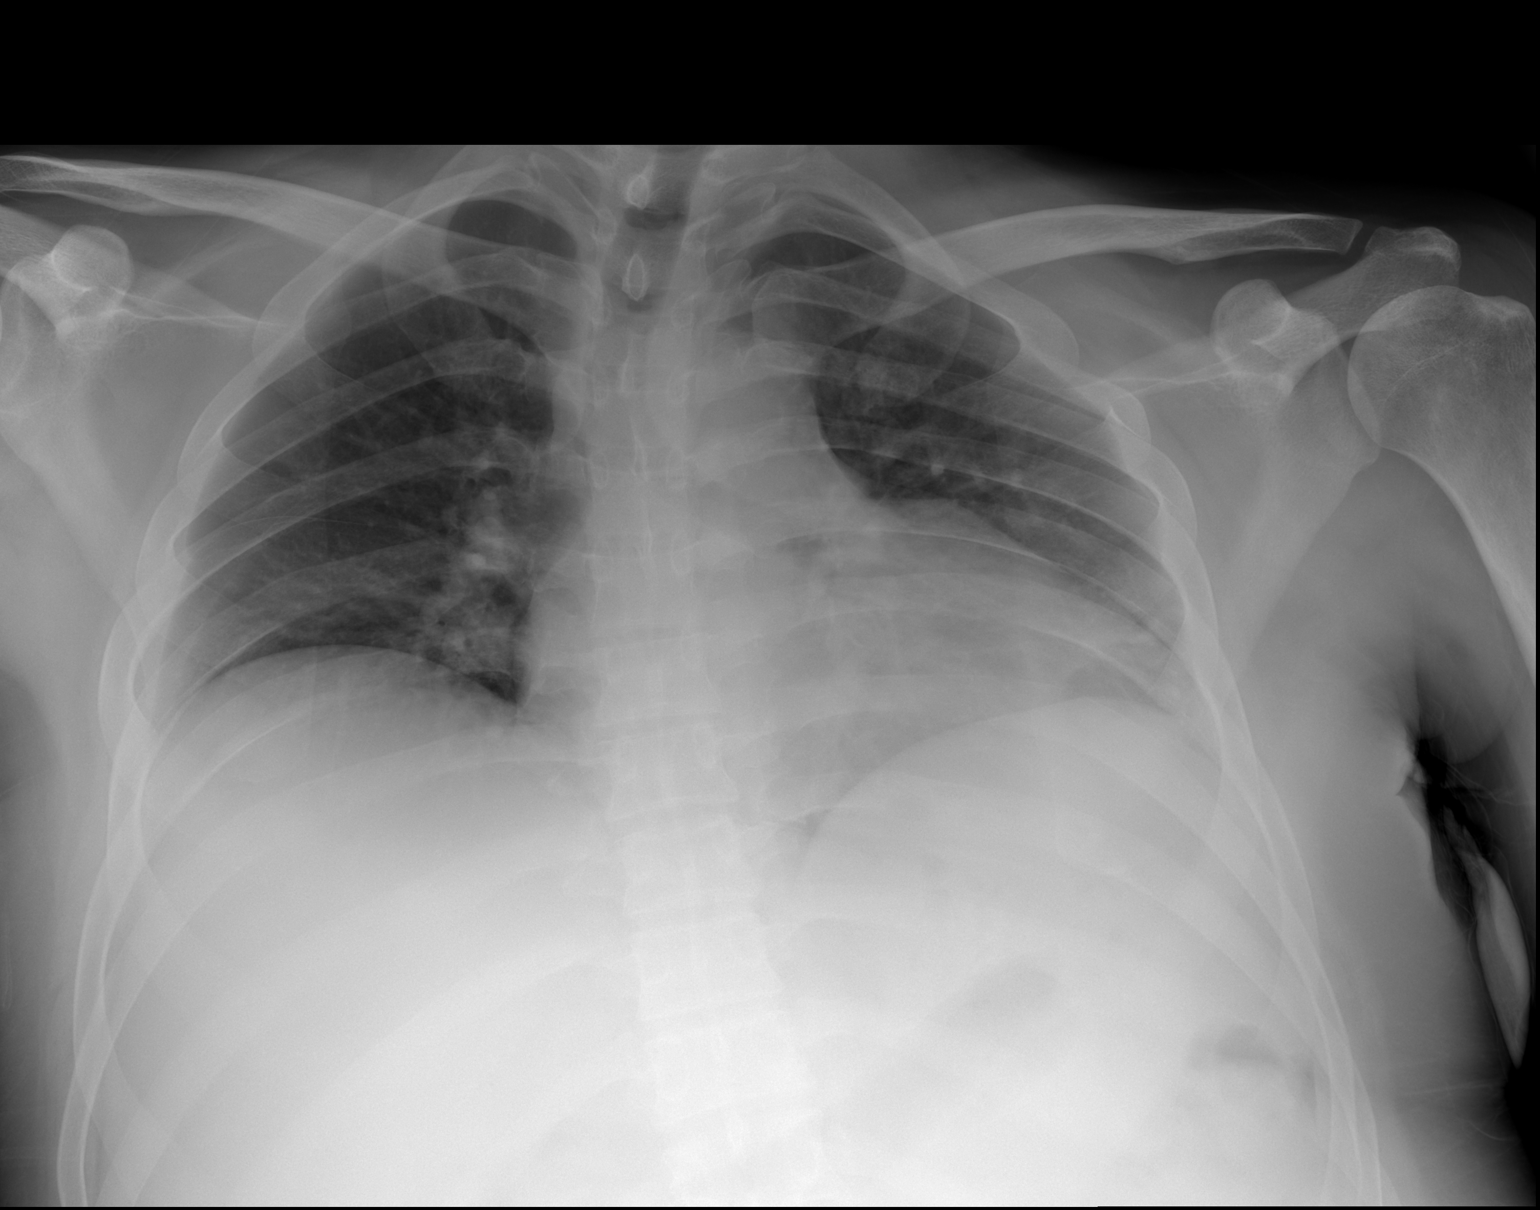

[w chest lat]
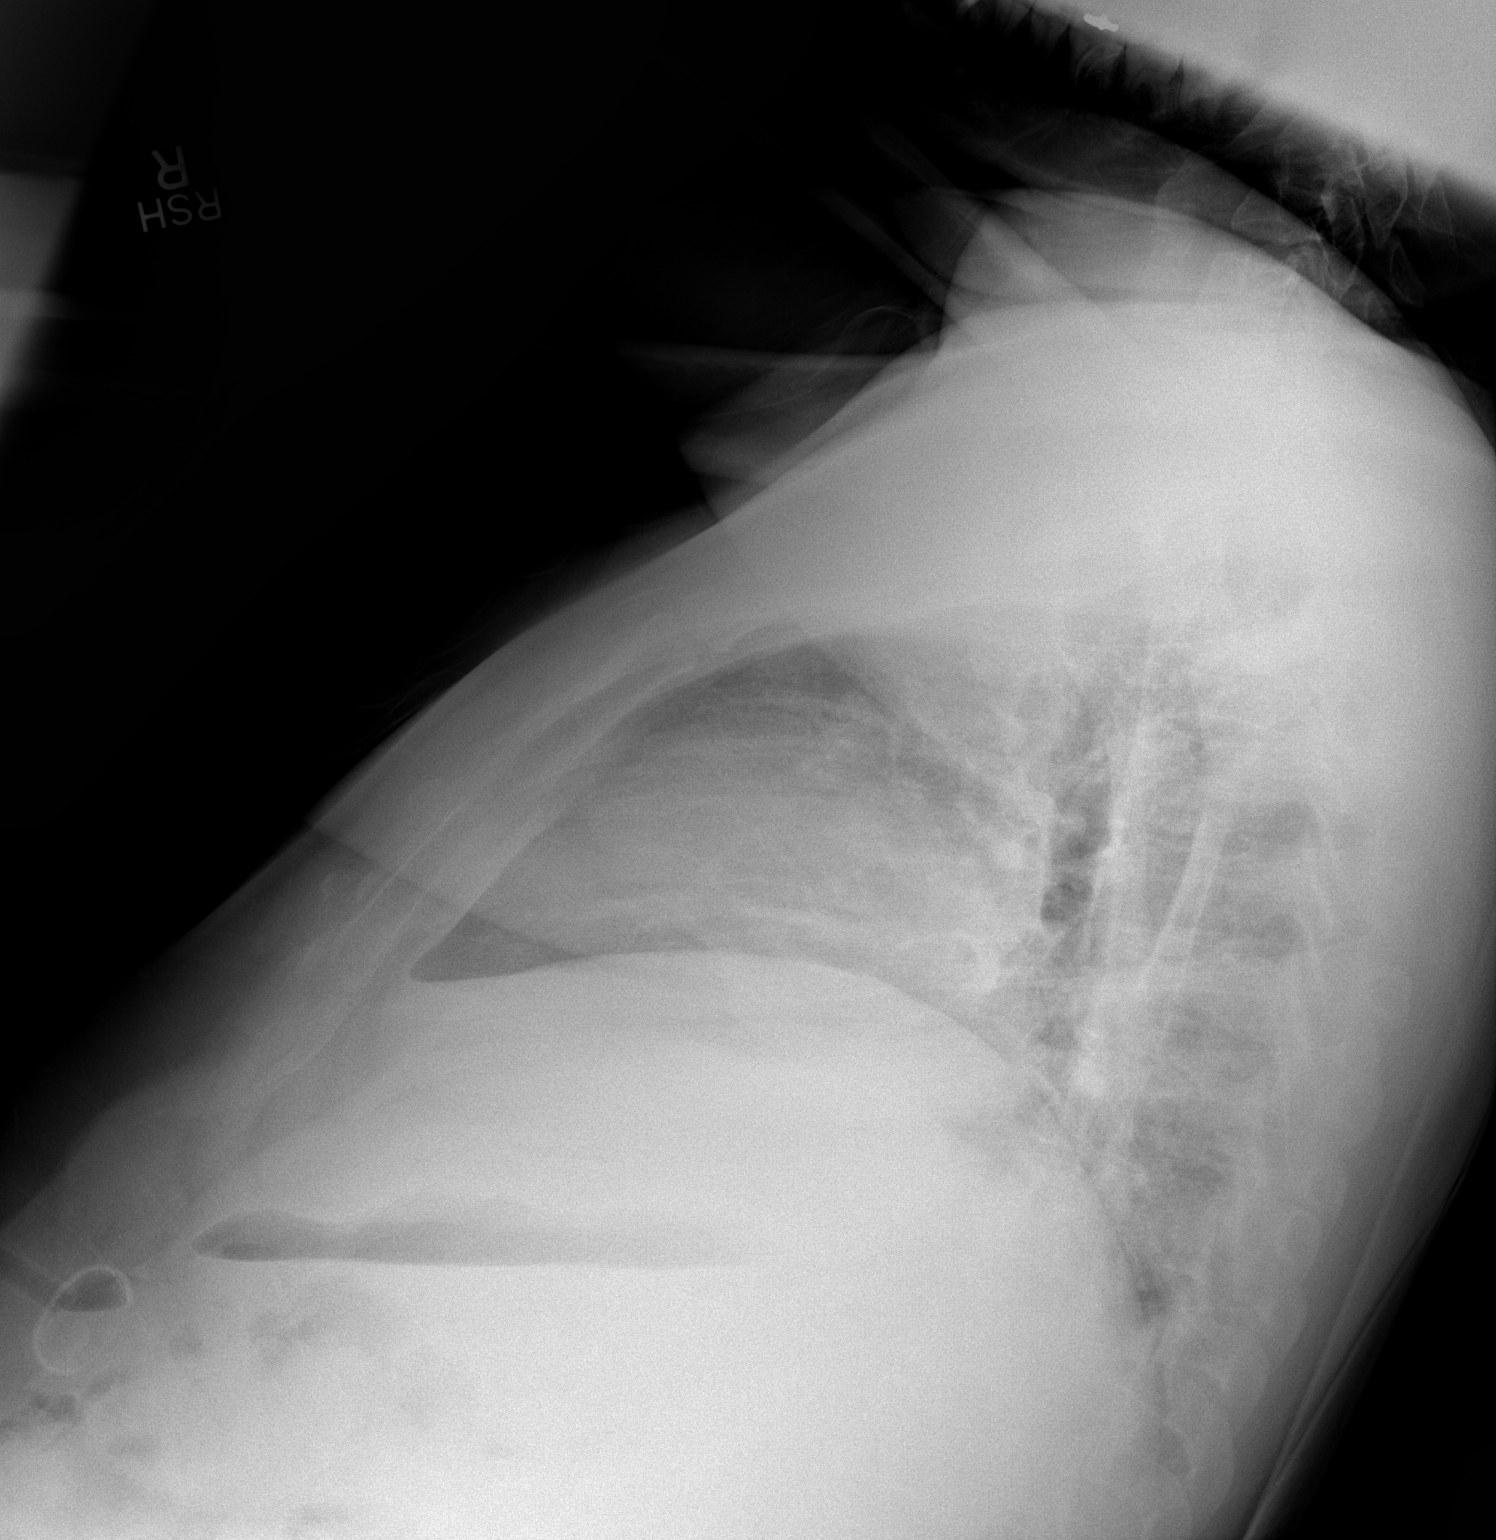

[2 of 2 positions shown; findings below may reference images not displayed]

FINDINGS: Heart size is exaggerate by low lung volumes. Left basilar airspace
disease has improved. There is no edema. Small effusions have
improved. The visualized soft tissues and bony thorax are
unremarkable.
IMPRESSION: 1. Improving left lower lobe airspace disease.
2. Decreasing small bilateral pleural effusions.

## 2017-04-26 ENCOUNTER — Encounter: Payer: Medicaid Other | Admitting: Physical Medicine & Rehabilitation

## 2017-05-25 ENCOUNTER — Encounter: Payer: Self-pay | Admitting: Physical Medicine & Rehabilitation

## 2017-05-25 ENCOUNTER — Encounter: Payer: Medicaid Other | Attending: Physical Medicine & Rehabilitation | Admitting: Physical Medicine & Rehabilitation

## 2017-05-25 VITALS — BP 161/99 | HR 68

## 2017-05-25 DIAGNOSIS — M109 Gout, unspecified: Secondary | ICD-10-CM | POA: Insufficient documentation

## 2017-05-25 DIAGNOSIS — K592 Neurogenic bowel, not elsewhere classified: Secondary | ICD-10-CM | POA: Diagnosis not present

## 2017-05-25 DIAGNOSIS — R531 Weakness: Secondary | ICD-10-CM | POA: Insufficient documentation

## 2017-05-25 DIAGNOSIS — N179 Acute kidney failure, unspecified: Secondary | ICD-10-CM | POA: Insufficient documentation

## 2017-05-25 DIAGNOSIS — N319 Neuromuscular dysfunction of bladder, unspecified: Secondary | ICD-10-CM | POA: Insufficient documentation

## 2017-05-25 DIAGNOSIS — G8194 Hemiplegia, unspecified affecting left nondominant side: Secondary | ICD-10-CM | POA: Diagnosis not present

## 2017-05-25 DIAGNOSIS — S069X4S Unspecified intracranial injury with loss of consciousness of 6 hours to 24 hours, sequela: Secondary | ICD-10-CM

## 2017-05-25 DIAGNOSIS — M792 Neuralgia and neuritis, unspecified: Secondary | ICD-10-CM

## 2017-05-25 DIAGNOSIS — Z87820 Personal history of traumatic brain injury: Secondary | ICD-10-CM | POA: Diagnosis not present

## 2017-05-25 DIAGNOSIS — G8114 Spastic hemiplegia affecting left nondominant side: Secondary | ICD-10-CM

## 2017-05-25 DIAGNOSIS — R202 Paresthesia of skin: Secondary | ICD-10-CM | POA: Insufficient documentation

## 2017-05-25 DIAGNOSIS — R1310 Dysphagia, unspecified: Secondary | ICD-10-CM | POA: Insufficient documentation

## 2017-05-25 NOTE — Patient Instructions (Signed)
NEEDS TO WORK ON CORRECT WALKING TECHNIQUES TO IMPROVE RIGHT ANKLE PAIN.   WALKING WITH TOES FORWARD AND STRIKING WITH HEEL WHEN THE FOOT HITS THE GROUND   IBUPROFEN, NAPROXEN, ICE TO HELP WITH PAIN.

## 2017-05-25 NOTE — Progress Notes (Signed)
Subjective:    Patient ID: [name] [name], male    DOB: [birth year], 26 y.o.   MRN: [id number]  HPI   [name] [name] is here in follow up of his TBI. He was last seen in April.  His left foot is numb continuously numb and the foot/ankle sometimes is painful if he is walking for longer distance of time.   He feels that his vision and cognition is back to baseline although he has some difficulty looking down with the left eye.  He denies any falls except for one fall off the couch in the summer. He's sleeping well. His mood has been improved.   Pain Inventory Average Pain 3 Pain Right Now 1 My pain is .  In the last 24 hours, has pain interfered with the following? General activity 0 Relation with others 0 Enjoyment of life 2 What TIME of day is your pain at its worst? daytime Sleep (in general) Good  Pain is worse with: walking, sitting, inactivity and standing Pain improves with: . Relief from Meds: .  Mobility walk without assistance ability to climb steps?  yes do you drive?  no  Function Do you have any goals in this area?  yes  Neuro/Psych No problems in this area  Prior Studies Any changes since last visit?  no  Physicians involved in your care Any changes since last visit?  no   No family history on file. Social History   Social History  . Marital status: Married    Spouse name: N/A  . Number of children: N/A  . Years of education: N/A   Social History Main Topics  . Smoking status: Never Smoker  . Smokeless tobacco: Never Used  . Alcohol use No     Comment: once a year  . Drug use: No  . Sexual activity: Yes   Other Topics Concern  . None   Social History Narrative  . None   Past Surgical History:  Procedure Laterality Date  . PEG TUBE PLACEMENT    . TRACHEOSTOMY     Past Medical History:  Diagnosis Date  . Acute renal failure (ARF) (HCC)   . Bacteremia   . Closed fracture of styloid process of right ulna with delayed healing   . Hemiplegia  affecting left nondominant side (HCC)   . Pneumonia   . SIRS (systemic inflammatory response syndrome) (HCC)   . Traumatic brain injury (HCC)    BP (!) 161/99   Pulse 68   SpO2 98%   Opioid Risk Score:   Fall Risk Score:  `1  Depression screen PHQ 2/9  Depression screen PHQ 2/9 06/24/2016  Decreased Interest 1  Down, Depressed, Hopeless 0  PHQ - 2 Score 1  Altered sleeping 1  Tired, decreased energy 0  Change in appetite 0  Feeling bad or failure about yourself  0  Trouble concentrating 0  Moving slowly or fidgety/restless 2  Suicidal thoughts 0  PHQ-9 Score 4     Review of Systems  Constitutional: Positive for chills, fever and unexpected weight change.  HENT: Negative.   Eyes: Negative.   Respiratory: Negative.   Cardiovascular: Negative.   Gastrointestinal: Negative.   Endocrine: Negative.   Genitourinary: Negative.   Musculoskeletal: Negative.   Skin: Negative.   Allergic/Immunologic: Negative.   Neurological: Negative.   Hematological: Negative.   Psychiatric/Behavioral: Negative.   All other systems reviewed and are negative.      Objective:   Physical Exam Gen: NAD.  HENT:  Normocephalic Eyes: EOMI in right eye. Left eye limited in vertical planes.  Neck: supple  Cardiovascular: rrr Respiratory: normal effort.Marland Kitchen  GI: BS+.  Neurological: a and oriented x 3 Tracks with right eye. Left eye limited vertically (IV) but he still denies diplopia  sensation intact in all 4's.   Motor nearly 5/5 in RUE and RLE. And 4+ LUE and LLE. Walks with ER in left leg. Tends to land on forefoot and not strike heel. Occasional catch in left shoulder and left leg.  Musculoskeletal: LEFT shoulder   ful ROM LLE. Mild pain with palpation left ankle. No swelling Psych: pleasant cooperative.  Skin: intact with scarring.      Assessment & Plan:  1. Weakness, left hemiparesthesiasecondary to TBI 03/02/2016 complicated by sepsis and respiratory  failure -Has made nice gains  -spoke about improving gait mechanics 2.  Pain Management: Tylenol as needed -may use ibuprofen or naproxen for left ankle pain, ice  -I suspect this is mostly mechanical as related to is left hemiparesis  -may have small neuropathic component  11.Gout. 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 HTN: per primary   Return in about 6 months. Over 15 minutes spent counseling pt/family regarding eye, gait mechanics, sources of pain, etc

## 2017-11-23 ENCOUNTER — Encounter: Payer: Self-pay | Admitting: Physical Medicine & Rehabilitation

## 2017-11-23 ENCOUNTER — Other Ambulatory Visit: Payer: Self-pay

## 2017-11-23 ENCOUNTER — Encounter: Payer: Medicaid Other | Attending: Physical Medicine & Rehabilitation | Admitting: Physical Medicine & Rehabilitation

## 2017-11-23 VITALS — BP 144/100 | HR 68 | Wt 209.0 lb

## 2017-11-23 DIAGNOSIS — Z9889 Other specified postprocedural states: Secondary | ICD-10-CM | POA: Diagnosis not present

## 2017-11-23 DIAGNOSIS — I10 Essential (primary) hypertension: Secondary | ICD-10-CM | POA: Diagnosis not present

## 2017-11-23 DIAGNOSIS — G8114 Spastic hemiplegia affecting left nondominant side: Secondary | ICD-10-CM | POA: Diagnosis not present

## 2017-11-23 DIAGNOSIS — M109 Gout, unspecified: Secondary | ICD-10-CM | POA: Diagnosis not present

## 2017-11-23 DIAGNOSIS — M792 Neuralgia and neuritis, unspecified: Secondary | ICD-10-CM

## 2017-11-23 NOTE — Progress Notes (Signed)
Subjective:    Patient ID: [name] [name], male    DOB: [birth year], 27 y.o.   MRN: [id number]  HPI   Patient is here after his severe traumatic brain injury.  I last saw him 6 months ago.  He states that over the last 6 months he has continued to improve.  He denies any pain except for when his gout flares up.  He has been getting around the house without any problems.  He tries to walk daily at least about 45 minutes.  He tells me he like to get back to work eventually.  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  He states that his vision has continued to improve.  He denies any double vision at present.  He has been in good spirits overall.    Pain Inventory Average Pain 0 Pain Right Now 0 My pain is intermittent  In the last 24 hours, has pain interfered with the following? General activity 0 Relation with others 0 Enjoyment of life 0 What TIME of day is your pain at its worst? n/a Sleep (in general) Good  Pain is worse with: n/a Pain improves with: n/a Relief from Meds: 2  Mobility walk without assistance use a cane how many minutes can you walk? 30 ability to climb steps?  yes do you drive?  no  Function not employed: date last employed n/a  Neuro/Psych No problems in this area  Prior Studies Any changes since last visit?  no  Physicians involved in your care Any changes since last visit?  no   No family history on file. Social History   Socioeconomic History  . Marital status: Married    Spouse name: Not on file  . Number of children: Not on file  . Years of education: Not on file  . Highest education level: Not on file  Occupational History  . Not on file  Social Needs  . Financial resource strain: Not on file  . Food insecurity:    Worry: Not on file    Inability: Not on file  . Transportation needs:    Medical: Not on file    Non-medical: Not on file  Tobacco Use  . Smoking status: Never Smoker  . Smokeless tobacco: Never Used  Substance  and Sexual Activity  . Alcohol use: No    Comment: once a year  . Drug use: No  . Sexual activity: Yes  Lifestyle  . Physical activity:    Days per week: Not on file    Minutes per session: Not on file  . Stress: Not on file  Relationships  . Social connections:    Talks on phone: Not on file    Gets together: Not on file    Attends religious service: Not on file    Active member of club or organization: Not on file    Attends meetings of clubs or organizations: Not on file    Relationship status: Not on file  Other Topics Concern  . Not on file  Social History Narrative  . Not on file   Past Surgical History:  Procedure Laterality Date  . PEG TUBE PLACEMENT    . TRACHEOSTOMY     Past Medical History:  Diagnosis Date  . Acute renal failure (ARF) (HCC)   . Bacteremia   . Closed fracture of styloid process of right ulna with delayed healing   . Hemiplegia affecting left nondominant side (HCC)   . Pneumonia   .  SIRS (systemic inflammatory response syndrome) (HCC)   . Traumatic brain injury (HCC)    BP (!) 144/100   Pulse 68   Wt 209 lb (94.8 kg)   SpO2 97%   BMI 32.73 kg/m   Opioid Risk Score:   Fall Risk Score:  `1  Depression screen PHQ 2/9  Depression screen [facility] LPHQ 2/9 11/23/2017 06/24/2016  Decreased Interest 0 1  Down, Depressed, Hopeless 0 0  PHQ - 2 Score 0 1  Altered sleeping - 1  Tired, decreased energy - 0  Change in appetite - 0  Feeling bad or failure about yourself  - 0  Trouble concentrating - 0  Moving slowly or fidgety/restless - 2  Suicidal thoughts - 0  PHQ-9 Score - 4   Review of Systems  Constitutional: Negative.   HENT: Negative.   Eyes: Negative.   Respiratory: Negative.   Cardiovascular: Negative.   Gastrointestinal: Negative.   Endocrine: Negative.   Genitourinary: Negative.   Musculoskeletal: Negative.   Skin: Negative.   Allergic/Immunologic: Negative.   Neurological: Negative.   Hematological: Negative.     Psychiatric/Behavioral: Negative.   All other systems reviewed and are negative.      Objective:   Physical Exam General: No acute distress HEENT: EOMI, oral membranes moist Cards: reg rate  Chest: normal effort Abdomen: Soft, NT, ND Skin: dry, intact Extremities: no edema Neurological: a and oriented x 3 Improved eye movemtn. Left eye lags vertically still a bit.    Motor nearly 5/5 in RUE and RLE. And 4+ LUE and LLE. Improved swing of left leg but still tends to step down into leg and favor it during stance phase.  Musculoskeletal: LEFT shoulder with functional movement. Struggled a bit to put on jacket still.   ful ROM LLE. Mild pain with palpation left ankle. No swelling Psych: pleasant cooperative.       Assessment & Plan:  1. History of severe TBI 03/02/2016 complicated by sepsis and respiratory failure -Has made nice gains overall             -needs to continue working on gait mechanics   -he wants to eventually get back to work. 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  Pain Management: Tylenol as needed -may use ibuprofen or naproxen for left ankle pain, ice             -gait mechanics as above 11.Gout. 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 HTN: per primary   F/U PRN. Fifteen minutes of face to face patient care time were spent during this visit. All questions were encouraged and answered.  Greater than 50% of this visit was spent in discussion regarding vocational capabilities and review of his gait mechanics.

## 2017-11-23 NOTE — Patient Instructions (Signed)
PLEASE FEEL FREE TO CALL OUR OFFICE WITH ANY PROBLEMS OR QUESTIONS ([phone])
